# Patient Record
Sex: Male | Born: 1995 | Race: White | Hispanic: No | Marital: Single | Smoking: Never smoker
Health system: Southern US, Community
[De-identification: ages and names within clinical notes are randomized; demographics above are authoritative.]

---

## 2017-04-30 ENCOUNTER — Emergency Department: Payer: Self-pay

## 2017-04-30 ENCOUNTER — Emergency Department
Admission: EM | Admit: 2017-04-30 | Discharge: 2017-04-30 | Disposition: A | Discharge status: Home / Self Care | Payer: Self-pay | Attending: Emergency Medicine | Admitting: Emergency Medicine

## 2017-04-30 ENCOUNTER — Encounter: Payer: Self-pay | Admitting: Emergency Medicine

## 2017-04-30 DIAGNOSIS — M25511 Pain in right shoulder: Secondary | ICD-10-CM | POA: Insufficient documentation

## 2017-04-30 DIAGNOSIS — S43101A Unspecified dislocation of right acromioclavicular joint, initial encounter: Secondary | ICD-10-CM

## 2017-04-30 DIAGNOSIS — F10920 Alcohol use, unspecified with intoxication, uncomplicated: Secondary | ICD-10-CM | POA: Insufficient documentation

## 2017-04-30 DIAGNOSIS — Y929 Unspecified place or not applicable: Secondary | ICD-10-CM | POA: Insufficient documentation

## 2017-04-30 DIAGNOSIS — Y999 Unspecified external cause status: Secondary | ICD-10-CM | POA: Insufficient documentation

## 2017-04-30 DIAGNOSIS — Y9389 Activity, other specified: Secondary | ICD-10-CM | POA: Insufficient documentation

## 2017-04-30 DIAGNOSIS — S43121A Dislocation of right acromioclavicular joint, 100%-200% displacement, initial encounter: Secondary | ICD-10-CM | POA: Insufficient documentation

## 2017-04-30 MED ORDER — KETOROLAC TROMETHAMINE 30 MG/ML IJ SOLN
30.0000 mg | Freq: Once | INTRAMUSCULAR | Status: AC
  Administered 2017-04-30: 30 mg via INTRAVENOUS
  Filled 2017-04-30: qty 1

## 2017-04-30 MED ORDER — KETOROLAC TROMETHAMINE 60 MG/2ML IM SOLN: 60.0000 mg | Freq: Once | INTRAMUSCULAR | Status: DC

## 2017-04-30 MED ORDER — TRAMADOL HCL 50 MG PO TABS
50.0000 mg | ORAL_TABLET | Freq: Once | ORAL | Status: AC
  Administered 2017-04-30: 50 mg via ORAL
  Filled 2017-04-30: qty 1

## 2017-04-30 MED ORDER — ETODOLAC 200 MG PO CAPS: 200.0000 mg | ORAL_CAPSULE | Freq: Three times a day (TID) | ORAL | 0 refills | Status: DC

## 2017-04-30 MED ORDER — TRAMADOL HCL 50 MG PO TABS: 50.0000 mg | ORAL_TABLET | Freq: Four times a day (QID) | ORAL | 0 refills | Status: DC | PRN
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
## 2017-04-30 NOTE — ED Notes (Signed)
Patient transported to CT 
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
## 2023-05-06 ENCOUNTER — Emergency Department (HOSPITAL_COMMUNITY)
Admission: EM | Admit: 2023-05-06 | Discharge: 2023-05-06 | Disposition: A | Discharge status: Home / Self Care | Payer: No Typology Code available for payment source | Attending: Emergency Medicine | Admitting: Emergency Medicine

## 2023-05-06 ENCOUNTER — Other Ambulatory Visit: Payer: Self-pay

## 2023-05-06 ENCOUNTER — Encounter (HOSPITAL_COMMUNITY): Payer: Self-pay | Admitting: Emergency Medicine

## 2023-05-06 DIAGNOSIS — S51011A Laceration without foreign body of right elbow, initial encounter: Secondary | ICD-10-CM | POA: Diagnosis not present

## 2023-05-06 DIAGNOSIS — W25XXXA Contact with sharp glass, initial encounter: Secondary | ICD-10-CM | POA: Insufficient documentation

## 2023-05-06 DIAGNOSIS — S41111A Laceration without foreign body of right upper arm, initial encounter: Secondary | ICD-10-CM

## 2023-05-06 MED ORDER — LIDOCAINE HCL (PF) 1 % IJ SOLN
5.0000 mL | Freq: Once | INTRAMUSCULAR | Status: AC
  Administered 2023-05-06: 5 mL via INTRADERMAL

## 2023-05-06 MED ORDER — LIDOCAINE HCL (PF) 1 % IJ SOLN
INTRAMUSCULAR | Status: AC
  Filled 2023-05-06: qty 5
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
## 2023-05-06 NOTE — ED Notes (Signed)
ED Provider at bedside. 
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
## 2023-05-06 NOTE — Discharge Instructions (Signed)
Local wound care with bacitracin and dressing changes twice daily.  Sutures are to be removed in 7 to 10 days.  Please follow-up with primary doctor, urgent care, or the ER for this.  Return to the ER sooner if you develop redness, increased pain, pus draining from the wound, streaks up or down the arm, or for other new and concerning symptoms.

## 2024-06-11 NOTE — Progress Notes (Signed)
 " Chief Complaint  Patient presents with   Anxiety    Depression/anxiety follow-up--meds   Medication Refill    Aripiprazole 5 mg, Quetiapine  50 mg, venlafaxine 75 mg   Medication Review    Subjective  Cory is a 28 y.o. male who presents for Anxiety (Depression/anxiety follow-up--meds), Medication Refill (Aripiprazole 5 mg, Quetiapine  50 mg, venlafaxine 75 mg), and Medication Review HPI History of Present Illness Cory Vance is a 28 year old male with a history of substance use disorder who presents for follow-up after completing rehab.  He has a history of alcohol use that initially decreased but later escalated, leading to the use of opiate pills like Percocet and hydrocodone. This problematic use prompted his decision to enter rehab. He has been sober for six months.  During rehab, he was prescribed Seroquel  for sleep difficulties and antidepressants. He is currently on a Suboxone  taper, reducing from 16 mg to 8 mg per day, and takes Effexor 225 mg three times daily, with his supply running low.  He experiences ongoing depression, which he feels has worsened since achieving sobriety. Depression contributed to his substance use as he sought to feel normal or good. Suboxone  helps prevent physical withdrawal symptoms, described as flu-like, but depression persists. No current psychiatric follow-up.  Review of Systems  Patient Active Problem List  Diagnosis   Separation of right acromioclavicular joint   Essential hypertension   Panic attacks   Major depression, melancholic type   Bicuspid aortic valve   Mild aortic stenosis by prior echocardiogram   Radial neuropathy, right    Outpatient Medications Prior to Visit  Medication Sig Dispense Refill   buprenorphine -naloxone  (SUBOXONE ) 8-2 mg SL film PLACE 1 FILM INSIDE THE CHEEK DAILY     cloNIDine  HCL (CATAPRES ) 0.1 MG tablet TAKE ONE TABLET BY MOUTH TWICE DAILY AS NEEDED 8AM AND 9PM. MAX TWO TABLETS.     ibuprofen  (ADVIL,MOTRIN) 200 MG tablet Take 800 mg by mouth every 6 (six) hours as needed for Pain.     ARIPiprazole (ABILIFY) 5 MG tablet TAKE ONE TABLET BY MOUTH ONCE DAILY 8AM     QUEtiapine  (SEROQUEL ) 50 MG tablet TAKE ONE TABLET BY MOUTH ONCE DAILY AS NEEDED AT 9PM FOR INSOMNIA. 24 HOUR MAX IS ONE TABLET     venlafaxine (EFFEXOR-XR) 75 MG XR capsule Take 1 capsule (75 mg total) by mouth once daily 30 capsule 11   aspirin 81 MG EC tablet Take 81 mg by mouth once daily (Patient not taking: Reported on 06/11/2024)     lisinopriL (ZESTRIL) 10 MG tablet Take 1 tablet (10 mg total) by mouth once daily (Patient not taking: Reported on 06/11/2024) 90 tablet 3   propranoloL (INDERAL) 20 MG tablet Take 1 tablet (20 mg total) by mouth 2 (two) times daily as needed (Patient not taking: Reported on 06/11/2024) 60 tablet 11   No facility-administered medications prior to visit.      Objective  Vitals:   06/11/24 0937  BP: 100/64  Pulse: 53  Weight: 100.3 kg (221 lb 1.9 oz)  PainSc: 0-No pain   Body mass index is 33.83 kg/m.  Home Vitals:     Physical Exam Physical Exam    Constitutional: alert, in NAD, and communicates well Eye exam: pupils equal and reactive, extraocular eye movements intact. Neck: supple, no thyroid enlargement or cervical adenopathy, and no bruits heard Respiratory: clear to auscultation, without rales or wheezes  Cardiovascular: regular rate and rhythm and without murmurs, rubs or gallops Lower  extremities: no lower extremity edema Skin ankles/feet: warm, good capillary refill and no ulcerations or lesions noted Neurological: sensorimotor grossly intact and normal muscle tone  Results       Assessment/Plan:   Assessment & Plan Substance Use Disorder History of alcohol and opiates, including Percocet and hydrocodone. Sober for six months post-rehabilitation. On Suboxone  taper from 16 mg to 8 mg daily. Attends men's group for support. Suboxone  prevents withdrawal  but not depression. - Continue Suboxone  taper. - Attend men's group for support.  Major Depressive Disorder Worsening depression since sobriety. On Effexor 225 mg daily with persistent symptoms. Discussed adding Wellbutrin to address cravings and improve mood. Wellbutrin is non-addictive, helps with cravings, may cause initial irritability. - Prescribe Wellbutrin in addition to Effexor. - Monitor mood and depressive symptoms. - Reassess in one month.  Insomnia Difficulty sleeping. Seroquel  50 mg effective, safe, and non-addictive for insomnia. - Continue Seroquel  50 mg for insomnia. Diagnoses and all orders for this visit:  Major depression, melancholic type -     Follow up in Primary Care; Future  Depression screening (Z13.31) -     Depression Screen -(PHQ- 2/9, BDI)  Encounter for behavioral health screening (Z13.30) -     Anxiety Screen [NUR6106]  Other orders -     Follow up in Primary Care -     venlafaxine (EFFEXOR-XR) 75 MG XR capsule; Take 3 capsules (225 mg total) by mouth once daily -     buPROPion (WELLBUTRIN XL) 150 MG XL tablet; Take 1 tablet (150 mg total) by mouth once daily -     ARIPiprazole (ABILIFY) 5 MG tablet; Take 1 tablet (5 mg total) by mouth once daily -     QUEtiapine  (SEROQUEL ) 50 MG tablet; Take 1 tablet (50 mg total) by mouth at bedtime        This visit was coded based on medical decision making (MDM).       Follow-up     Normal Orders This Visit   Follow up in Primary Care [[id number] Custom]    Questions:   Does this order need to be coordinated with another visit or should it be hidden from the patient portal? If yes to either, the patient will need to stop by the front desk or call to schedule.: No   Who is this follow-up with?: Me   Can this appointment be overbooked by a scheduler?: No   What type of follow up is needed?: Office Visit   What's the reason for follow up?: Depression/Anxiety   Allow telemedicine?: In Person Only     Future Labs/Procedures Expected by Expires   Follow up in Primary Care [[id number] Custom]  07/12/2024 06/11/2025   Questions:     Does this order need to be coordinated with another visit or should it be hidden from the patient portal? If yes to either, the patient will need to stop by the front desk or call to schedule.: No   Who is this follow-up with?: Me   Can this appointment be overbooked by a scheduler?: No   What type of follow up is needed?: Office Visit   What's the reason for follow up?: Depression/Anxiety   Allow telemedicine?: In Person Only         Future Appointments     Date/Time Provider Department Center Visit Type   07/12/2024 8:40 AM (Arrive by 8:25 AM) Zina, Selma Nottingham, MD [facility] OFFICE VISIT  There are no Patient Instructions on file for this visit.  An after visit summary was provided for the patient either in written format (printed) or through [facility].  This note has been created using automated tools and reviewed for accuracy by ALEXANDRE W HUIN.  "

## 2024-12-11 ENCOUNTER — Encounter: Payer: Self-pay | Admitting: *Deleted

## 2024-12-11 ENCOUNTER — Other Ambulatory Visit: Payer: Self-pay

## 2024-12-11 ENCOUNTER — Emergency Department
Admission: EM | Admit: 2024-12-11 | Discharge: 2024-12-12 | Disposition: A | Attending: Emergency Medicine | Admitting: Emergency Medicine

## 2024-12-11 DIAGNOSIS — R45851 Suicidal ideations: Secondary | ICD-10-CM | POA: Insufficient documentation

## 2024-12-11 DIAGNOSIS — Y906 Blood alcohol level of 120-199 mg/100 ml: Secondary | ICD-10-CM | POA: Diagnosis not present

## 2024-12-11 DIAGNOSIS — R519 Headache, unspecified: Secondary | ICD-10-CM | POA: Insufficient documentation

## 2024-12-11 DIAGNOSIS — F1010 Alcohol abuse, uncomplicated: Secondary | ICD-10-CM

## 2024-12-11 DIAGNOSIS — F1090 Alcohol use, unspecified, uncomplicated: Secondary | ICD-10-CM | POA: Diagnosis present

## 2024-12-11 DIAGNOSIS — F332 Major depressive disorder, recurrent severe without psychotic features: Secondary | ICD-10-CM | POA: Diagnosis present

## 2024-12-11 DIAGNOSIS — F10988 Alcohol use, unspecified with other alcohol-induced disorder: Secondary | ICD-10-CM | POA: Diagnosis not present

## 2024-12-11 DIAGNOSIS — I10 Essential (primary) hypertension: Secondary | ICD-10-CM | POA: Insufficient documentation

## 2024-12-11 DIAGNOSIS — R112 Nausea with vomiting, unspecified: Secondary | ICD-10-CM | POA: Insufficient documentation

## 2024-12-11 DIAGNOSIS — Z046 Encounter for general psychiatric examination, requested by authority: Secondary | ICD-10-CM

## 2024-12-11 LAB — COMPREHENSIVE METABOLIC PANEL WITH GFR
ALT: 99 U/L — ABNORMAL HIGH (ref 0–44)
AST: 52 U/L — ABNORMAL HIGH (ref 15–41)
Albumin: 5.3 g/dL — ABNORMAL HIGH (ref 3.5–5.0)
Alkaline Phosphatase: 102 U/L (ref 38–126)
Anion gap: 14 (ref 5–15)
BUN: 9 mg/dL (ref 6–20)
CO2: 25 mmol/L (ref 22–32)
Calcium: 10 mg/dL (ref 8.9–10.3)
Chloride: 102 mmol/L (ref 98–111)
Creatinine, Ser: 0.88 mg/dL (ref 0.61–1.24)
GFR, Estimated: >60 mL/min
Glucose, Bld: 109 mg/dL — ABNORMAL HIGH (ref 70–99)
Potassium: 4 mmol/L (ref 3.5–5.1)
Sodium: 141 mmol/L (ref 135–145)
Total Bilirubin: 0.3 mg/dL (ref 0.0–1.2)
Total Protein: 8.5 g/dL — ABNORMAL HIGH (ref 6.5–8.1)

## 2024-12-11 LAB — CBC
HCT: 43.2 % (ref 39.0–52.0)
Hemoglobin: 15.2 g/dL (ref 13.0–17.0)
MCH: 28.7 pg (ref 26.0–34.0)
MCHC: 35.2 g/dL (ref 30.0–36.0)
MCV: 81.7 fL (ref 80.0–100.0)
Platelets: 299 10*3/uL (ref 150–400)
RBC: 5.29 MIL/uL (ref 4.22–5.81)
RDW: 12.1 % (ref 11.5–15.5)
WBC: 7.2 10*3/uL (ref 4.0–10.5)
nRBC: 0 % (ref 0.0–0.2)

## 2024-12-11 LAB — ETHANOL: Alcohol, Ethyl (B): 142 mg/dL — ABNORMAL HIGH

## 2024-12-11 MED ORDER — BUPRENORPHINE HCL-NALOXONE HCL 8-2 MG SL SUBL
1.0000 | SUBLINGUAL_TABLET | SUBLINGUAL | Status: AC
  Administered 2024-12-12: 1 via SUBLINGUAL
  Filled 2024-12-11: qty 1

## 2024-12-11 MED ORDER — FOLIC ACID 1 MG PO TABS
1.0000 mg | ORAL_TABLET | Freq: Every day | ORAL | Status: DC
  Administered 2024-12-12: 1 mg via ORAL
  Filled 2024-12-11: qty 1

## 2024-12-11 MED ORDER — LORAZEPAM 1 MG PO TABS: 1.0000 mg | ORAL_TABLET | ORAL | Status: DC | PRN

## 2024-12-11 MED ORDER — THIAMINE MONONITRATE 100 MG PO TABS
100.0000 mg | ORAL_TABLET | Freq: Every day | ORAL | Status: DC
  Administered 2024-12-12: 100 mg via ORAL
  Filled 2024-12-11: qty 1

## 2024-12-11 MED ORDER — ADULT MULTIVITAMIN W/MINERALS CH
1.0000 | ORAL_TABLET | Freq: Every day | ORAL | Status: DC
  Administered 2024-12-12: 1 via ORAL
  Filled 2024-12-11: qty 1

## 2024-12-11 MED ORDER — KETOROLAC TROMETHAMINE 30 MG/ML IJ SOLN
30.0000 mg | Freq: Once | INTRAMUSCULAR | Status: AC
  Administered 2024-12-11: 30 mg via INTRAMUSCULAR
  Filled 2024-12-11: qty 1

## 2024-12-11 MED ORDER — ONDANSETRON 4 MG PO TBDP
8.0000 mg | ORAL_TABLET | Freq: Once | ORAL | Status: AC
  Administered 2024-12-11: 8 mg via ORAL
  Filled 2024-12-11: qty 2

## 2024-12-11 MED ORDER — LORAZEPAM 2 MG/ML IJ SOLN: 1.0000 mg | INTRAMUSCULAR | Status: DC | PRN

## 2024-12-11 MED ORDER — THIAMINE HCL 100 MG/ML IJ SOLN: 100.0000 mg | Freq: Every day | INTRAMUSCULAR | Status: DC
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
## 2024-12-11 NOTE — ED Provider Notes (Signed)
 "  [facility] Provider Note    Event Date/Time   First MD Initiated Contact with Patient 12/11/24 2313     (approximate)   History   Psychiatric Evaluation   HPI  Cory Vance is a 29 y.o. male history of substance abuse Reviewed external records from 06/11/25 Dr. Zina  He has a history of alcohol use that initially decreased but later escalated, leading to the use of opiate pills like Percocet and hydrocodone. This problematic use prompted his decision to enter rehab. He has been sober for six months.   During rehab, he was prescribed Seroquel  for sleep difficulties and antidepressants. He is currently on a Suboxone  taper, reducing from 16 mg to 8 mg per day, and takes Effexor 225 mg three times daily, with his supply running low.   He experiences ongoing depression, which he feels has worsened since achieving sobriety. Depression contributed to his substance use as he sought to feel normal or good. Suboxone  helps prevent physical withdrawal symptoms, described as flu-like, but depression persists. No current psychiatric follow-up.     Review of Systems      Patient Active Problem List  Diagnosis   Separation of right acromioclavicular joint   Essential hypertension   Panic attacks   Major depression, melancholic type   Bicuspid aortic valve   Mild aortic stenosis by prior echocardiogram   Radial neuropathy, right    Patient report he started drinking he has not drank alcohol in about 6 or more months.  Today he estimates he had at least 4 high alcohol drinks.  He then went out to his garage reports that he moved his lawn mower and was planning to take his car out when the police arrived.  He does not specifically recall being suicidal, reports that he does not remember all of it because he was drinking so heavily.  Apparently reported to have made threats of suicide and grabbed a gun going out into his garage prompting call to police  Presently  reports a mild headache some nausea.  Reports he also has not had Suboxone  in about 2 days and that will lead to him nausea and vomiting if he does not get it soon.  No recent medical illness.  Reports a mild to moderate headache, thinks it is probably related to having drank too much alcohol tonight.  Denies being in an enclosed space with running engines.  History reviewed. No pertinent past medical history.   Physical Exam   Triage Vital Signs: ED Triage Vitals  Encounter Vitals Group     BP 12/11/24 2244 (!) 141/117     Girls Systolic BP Percentile --      Girls Diastolic BP Percentile --      Boys Systolic BP Percentile --      Boys Diastolic BP Percentile --      Pulse Rate 12/11/24 2244 (!) 108     Resp 12/11/24 2244 18     Temp 12/11/24 2244 98.2 F (36.8 C)     Temp Source 12/11/24 2244 Oral     SpO2 12/11/24 2244 97 %     Weight 12/11/24 2242 220 lb (99.8 kg)     Height 12/11/24 2242 5' 9 (1.753 m)     Head Circumference --      Peak Flow --      Pain Score --      Pain Loc --      Pain Education --  Exclude from Growth Chart --     Most recent vital signs: Vitals:   12/11/24 2244 12/11/24 2318  BP: (!) 141/117 (!) 147/117  Pulse: (!) 108 (!) 108  Resp: 18   Temp: 98.2 F (36.8 C)   SpO2: 97%      General: Awake, no distress.  CV:   Good peripheral perfusion. Normal rate and heart tones. Resp:   Normal effort. Lung sounds clear bilateral. Speaking without distress. Abd:   No distention. Soft, non-tender to palpation in all quadrants. No rebound or guarding. Neuro:   No focal neuro deficits noted. Moves extremities well without noted concern. Other:     ED Results / Procedures / Treatments   Labs (all labs ordered are listed, but only abnormal results are displayed) Labs Reviewed  COMPREHENSIVE METABOLIC PANEL WITH GFR - Abnormal; Notable for the following components:      Result Value   Glucose, Bld 109 (*)    Total Protein 8.5 (*)     Albumin 5.3 (*)    AST 52 (*)    ALT 99 (*)    All other components within normal limits  ETHANOL - Abnormal; Notable for the following components:   Alcohol, Ethyl (B) 142 (*)    All other components within normal limits  COOXEMETRY PANEL - Abnormal; Notable for the following components:   Carboxyhemoglobin 2.0 (*)    All other components within normal limits  ACETAMINOPHEN LEVEL - Abnormal; Notable for the following components:   Acetaminophen (Tylenol), Serum <10 (*)    All other components within normal limits  SALICYLATE LEVEL - Abnormal; Notable for the following components:   Salicylate Lvl <7.0 (*)    All other components within normal limits  CBC  URINE DRUG SCREEN   Normal CBC  Coox reassuing  Chemistry panel no acute departures other than mild transaminitis but he has no associated abdominal pain and with his heavy alcohol history in the past, I do not think this is an acute hepatitis type picture probably more of a chronic hepatic irritation inflammation or alcohol related liver disease though the pattern is slightly more so that of a NASH or fatty liver or steatosis type pattern  Nonetheless without associate abdominal pain white count fever I do not think there is acute clinical concern       PROCEDURES:  Critical Care performed: No  Procedures   MEDICATIONS ORDERED IN ED: Medications  LORazepam  (ATIVAN ) tablet 1-4 mg (has no administration in time range)    Or  LORazepam  (ATIVAN ) injection 1-4 mg (has no administration in time range)  thiamine  (VITAMIN B1) tablet 100 mg (has no administration in time range)    Or  thiamine  (VITAMIN B1) injection 100 mg (has no administration in time range)  folic acid  (FOLVITE ) tablet 1 mg (has no administration in time range)  multivitamin with minerals tablet 1 tablet (has no administration in time range)  ketorolac  (TORADOL ) 30 MG/ML injection 30 mg (30 mg Intramuscular Given 12/11/24 2355)  ondansetron  (ZOFRAN -ODT)  disintegrating tablet 8 mg (8 mg Oral Given 12/11/24 2355)  buprenorphine -naloxone  (SUBOXONE ) 8-2 mg per SL tablet 1 tablet (1 tablet Sublingual Given 12/12/24 0011)     IMPRESSION / MDM / ASSESSMENT AND PLAN / ED COURSE  I reviewed the triage vital signs and the nursing notes.                              Based  on presentation, the differential diagnosis includes, but is not limited to key considerations:  Patient denies making suicidal statement, but also reports he drank heavily does not seem to be clearly remembering all of the night.  Normocephalic atraumatic very pleasant.  Remorseful about drinking reports he was sober for 6+ months before that.  Under IVC pending psych consult.  He reports headache, neurovascular intact no focal deficits.  I am suspicious that this may be related to heavy alcohol use, but given report of a possible riding lawnmower and him being out in the garage around a running vehicle now with a headache we will check a carboxyhemoglobin though I suspect based on the history provides very unlikely to represent carbon oxide  Salicylate acetaminophen levels negative    Patient's presentation is most consistent with acute complicated illness / injury requiring diagnostic workup.    Clinical Course as of 12/12/24 0411  Wed Dec 12, 2024  0054 Resp therapy called. Normal met hgb, normal CO. [MQ]    Clinical Course User Index [MQ] Dicky Anes, MD   ----------------------------------------- 4:11 AM on 12/12/2024 ----------------------------------------- Patient resting.  If medically appropriate to undergo psychiatric consult at this time  The patient has been placed in psychiatric observation due to the need to provide a safe environment for the patient while obtaining psychiatric consultation and evaluation, as well as ongoing medical and medication management to treat the patient's condition.  The patient Gerri Breeze at this time been placed under full IVC at  this time.   FINAL CLINICAL IMPRESSION(S) / ED DIAGNOSES   Final diagnoses:  Alcohol abuse  Involuntary commitment  Acute nonintractable headache, unspecified headache type     Rx / DC Orders   ED Discharge Orders     None        Note:  This document was prepared using Dragon voice recognition software and may include unintentional dictation errors.   Dicky Anes, MD 12/12/24 0411  "

## 2024-12-11 NOTE — ED Notes (Addendum)
 Camo clogs [employer] Black pants Black socks Green camo  coat  [employer] boxers

## 2024-12-12 ENCOUNTER — Encounter: Payer: Self-pay | Admitting: Psychiatry

## 2024-12-12 ENCOUNTER — Inpatient Hospital Stay
Admission: AD | Admit: 2024-12-12 | Discharge: 2024-12-19 | Disposition: A | Discharge status: Home / Self Care | Source: Ambulatory Visit | Attending: Psychiatry | Admitting: Psychiatry

## 2024-12-12 DIAGNOSIS — F1090 Alcohol use, unspecified, uncomplicated: Secondary | ICD-10-CM | POA: Diagnosis present

## 2024-12-12 DIAGNOSIS — F332 Major depressive disorder, recurrent severe without psychotic features: Secondary | ICD-10-CM | POA: Diagnosis present

## 2024-12-12 DIAGNOSIS — Z046 Encounter for general psychiatric examination, requested by authority: Secondary | ICD-10-CM

## 2024-12-12 DIAGNOSIS — R45851 Suicidal ideations: Secondary | ICD-10-CM

## 2024-12-12 LAB — COOXEMETRY PANEL
Carboxyhemoglobin: 2 % — ABNORMAL HIGH (ref 0.5–1.5)
Methemoglobin: <0.7 % (ref 0.0–1.5)
O2 Saturation: 95 %
Total hemoglobin: 14.5 g/dL (ref 12.0–16.0)
Total oxygen content: 92.6 %

## 2024-12-12 LAB — SALICYLATE LEVEL: Salicylate Lvl: <7 mg/dL — ABNORMAL LOW (ref 7.0–30.0)

## 2024-12-12 LAB — ACETAMINOPHEN LEVEL: Acetaminophen (Tylenol), Serum: <10 ug/mL — ABNORMAL LOW (ref 10–30)

## 2024-12-12 MED ORDER — HALOPERIDOL LACTATE 5 MG/ML IJ SOLN: 10.0000 mg | Freq: Three times a day (TID) | INTRAMUSCULAR | Status: DC | PRN

## 2024-12-12 MED ORDER — CHLORDIAZEPOXIDE HCL 25 MG PO CAPS: 25.0000 mg | ORAL_CAPSULE | Freq: Four times a day (QID) | ORAL | Status: DC | PRN

## 2024-12-12 MED ORDER — THIAMINE HCL 100 MG/ML IJ SOLN
100.0000 mg | Freq: Once | INTRAMUSCULAR | Status: AC
  Administered 2024-12-12: 100 mg via INTRAMUSCULAR
  Filled 2024-12-12: qty 2

## 2024-12-12 MED ORDER — NICOTINE 14 MG/24HR TD PT24
14.0000 mg | MEDICATED_PATCH | Freq: Every day | TRANSDERMAL | Status: DC
  Administered 2024-12-12 – 2024-12-18 (×7): 14 mg via TRANSDERMAL
  Filled 2024-12-12 (×7): qty 1

## 2024-12-12 MED ORDER — CHLORDIAZEPOXIDE HCL 25 MG PO CAPS: 25.0000 mg | ORAL_CAPSULE | ORAL | Status: DC

## 2024-12-12 MED ORDER — CLONIDINE HCL 0.1 MG PO TABS: 0.1000 mg | ORAL_TABLET | Freq: Every day | ORAL | Status: DC

## 2024-12-12 MED ORDER — HYDROXYZINE HCL 25 MG PO TABS
25.0000 mg | ORAL_TABLET | Freq: Four times a day (QID) | ORAL | Status: AC | PRN
  Administered 2024-12-13 – 2024-12-14 (×2): 25 mg via ORAL
  Filled 2024-12-12 (×2): qty 1

## 2024-12-12 MED ORDER — LORAZEPAM 2 MG/ML IJ SOLN: 2.0000 mg | Freq: Three times a day (TID) | INTRAMUSCULAR | Status: DC | PRN

## 2024-12-12 MED ORDER — CHLORDIAZEPOXIDE HCL 25 MG PO CAPS
25.0000 mg | ORAL_CAPSULE | Freq: Four times a day (QID) | ORAL | Status: AC
  Administered 2024-12-12 – 2024-12-13 (×4): 25 mg via ORAL
  Filled 2024-12-12 (×4): qty 1

## 2024-12-12 MED ORDER — HALOPERIDOL 5 MG PO TABS: 5.0000 mg | ORAL_TABLET | Freq: Three times a day (TID) | ORAL | Status: DC | PRN

## 2024-12-12 MED ORDER — HALOPERIDOL LACTATE 5 MG/ML IJ SOLN: 5.0000 mg | Freq: Three times a day (TID) | INTRAMUSCULAR | Status: DC | PRN

## 2024-12-12 MED ORDER — CLONIDINE HCL 0.1 MG PO TABS
0.1000 mg | ORAL_TABLET | Freq: Four times a day (QID) | ORAL | Status: DC
  Administered 2024-12-12 (×2): 0.1 mg via ORAL
  Filled 2024-12-12 (×5): qty 1

## 2024-12-12 MED ORDER — DIPHENHYDRAMINE HCL 25 MG PO CAPS: 50.0000 mg | ORAL_CAPSULE | Freq: Three times a day (TID) | ORAL | Status: DC | PRN

## 2024-12-12 MED ORDER — QUETIAPINE FUMARATE 25 MG PO TABS
50.0000 mg | ORAL_TABLET | Freq: Every day | ORAL | Status: DC
  Administered 2024-12-12 – 2024-12-15 (×4): 50 mg via ORAL
  Filled 2024-12-12 (×4): qty 2

## 2024-12-12 MED ORDER — CHLORDIAZEPOXIDE HCL 25 MG PO CAPS: 25.0000 mg | ORAL_CAPSULE | Freq: Every day | ORAL | Status: DC

## 2024-12-12 MED ORDER — CLONIDINE HCL 0.1 MG PO TABS: 0.1000 mg | ORAL_TABLET | ORAL | Status: DC

## 2024-12-12 MED ORDER — DIPHENHYDRAMINE HCL 50 MG/ML IJ SOLN: 50.0000 mg | Freq: Three times a day (TID) | INTRAMUSCULAR | Status: DC | PRN

## 2024-12-12 MED ORDER — LOPERAMIDE HCL 2 MG PO CAPS: 2.0000 mg | ORAL_CAPSULE | ORAL | Status: AC | PRN

## 2024-12-12 MED ORDER — CHLORDIAZEPOXIDE HCL 25 MG PO CAPS: 25.0000 mg | ORAL_CAPSULE | Freq: Three times a day (TID) | ORAL | Status: DC

## 2024-12-12 MED ORDER — ONDANSETRON 4 MG PO TBDP: 4.0000 mg | ORAL_TABLET | Freq: Four times a day (QID) | ORAL | Status: AC | PRN

## 2024-12-12 MED ORDER — METHOCARBAMOL 500 MG PO TABS: 500.0000 mg | ORAL_TABLET | Freq: Three times a day (TID) | ORAL | Status: AC | PRN

## 2024-12-12 MED ORDER — NAPROXEN 500 MG PO TABS: 500.0000 mg | ORAL_TABLET | Freq: Two times a day (BID) | ORAL | Status: AC | PRN

## 2024-12-12 MED ORDER — DICYCLOMINE HCL 20 MG PO TABS: 20.0000 mg | ORAL_TABLET | Freq: Four times a day (QID) | ORAL | Status: AC | PRN

## 2024-12-12 MED ORDER — ADULT MULTIVITAMIN W/MINERALS CH
1.0000 | ORAL_TABLET | Freq: Every day | ORAL | Status: DC
  Administered 2024-12-12 – 2024-12-19 (×8): 1 via ORAL
  Filled 2024-12-12 (×8): qty 1

## 2024-12-12 NOTE — TOC Initial Note (Signed)
 Transition of Care [facility]) - Initial/Assessment Note    Patient Details  Name: Cory Vance MRN: [id number] Date of Birth: 11-Mar-1996  [facility]) CM/SW Contact:    Kaileb Monsanto L Hymen Arnett, LCSW Phone Number: 12/12/2024, 8:56 AM  Clinical Narrative:                  [facility] consult received for substance abuse education/counseling. TOC does not provide education/counseling. Resources add to the AVS for patient to follow up.        Patient Goals and CMS Choice            Expected Discharge Plan and Services                                              Prior Living Arrangements/Services                       Activities of Daily Living      Permission Sought/Granted                  Emotional Assessment              Admission diagnosis:  IVC Patient Active Problem List   Diagnosis Date Noted   MDD (major depressive disorder), recurrent severe, without psychosis (HCC) 12/12/2024   Alcohol use disorder 12/12/2024   Suicidal ideation 12/12/2024   Involuntary commitment 12/12/2024   PCP:  Freddrick No Pharmacy:   [facility] - [place], [place] - [address] [place] [address] Phone: [phone] Fax: [phone]     Social Drivers of Health (SDOH) Social History: SDOH Screenings   Food Insecurity: No Food Insecurity (04/29/2024)   Received from [facility]  Housing: Low Risk  (04/29/2024)   Received from [facility]  Transportation Needs: No Transportation Needs (04/29/2024)   Received from [facility]  Utilities: Not At Risk (04/29/2024)   Received from [facility]  Financial Resource Strain: Low Risk  (04/29/2024)   Received from [facility]  Tobacco Use: Unknown (12/12/2024)   SDOH Interventions:     Readmission Risk Interventions     No data to display

## 2024-12-12 NOTE — Progress Notes (Signed)
" °   12/12/24 1116  Psych Admission Type (Psych Patients Only)  Admission Status Involuntary  Psychosocial Assessment  Patient Complaints Depression;Anxiety (6/10)  Eye Contact Avoids  Facial Expression Flat  Affect Blunted  Speech Logical/coherent  Interaction Minimal;Superficial  Motor Activity Slow  Appearance/Hygiene Poor hygiene;In scrubs;Disheveled  Behavior Characteristics Guarded  Mood Depressed  Thought Process  Coherency WDL  Content WDL  Delusions None reported or observed  Perception WDL  Hallucination None reported or observed  Judgment Impaired  Confusion WDL  Danger to Self  Current suicidal ideation? Denies    "

## 2024-12-12 NOTE — Group Note (Signed)
 [facility] LCSW Group Therapy Note   Group Date: 12/12/2024 Start Time: 1300 End Time: 1440   Type of Therapy/Topic:  Group Therapy:  Emotion Regulation  Participation Level:  Did Not Attend   Description of Group:    The purpose of this group is to assist patients in learning to regulate negative emotions and experience positive emotions. Patients will be guided to discuss ways in which they have been vulnerable to their negative emotions. These vulnerabilities will be juxtaposed with experiences of positive emotions or situations, and patients challenged to use positive emotions to combat negative ones. Special emphasis will be placed on coping with negative emotions in conflict situations, and patients will process healthy conflict resolution skills.  Therapeutic Goals: Patient will identify two positive emotions or experiences to reflect on in order to balance out negative emotions:  Patient will label two or more emotions that they find the most difficult to experience:  Patient will be able to demonstrate positive conflict resolution skills through discussion or role plays:   Summary of Patient Progress: Patient did not attend group.  Therapeutic Modalities:   Cognitive Behavioral Therapy Feelings Identification Dialectical Behavioral Therapy   Nadara JONELLE Fam, LCSW

## 2024-12-12 NOTE — BH Assessment (Addendum)
 Patient is to be admitted to [facility] by Psych NP Virginia  Georgina.  Attending Physician will be Dr. Jadapalle.   Patient has been assigned to room 302, by [facility].   Intake Paper Work has been signed and placed on patient chart.  ER staff is aware of the admission: Carlene, ER Secretary   Dr. Dicky, ER MD  Daralyn, Patient's Nurse    Patient accepted to BMU 302 PENDING UDS and  EKG. Pt can arrive 1/28 at 0800

## 2024-12-12 NOTE — Group Note (Signed)
 Date:  12/12/2024 Time:  10:16 AM  Group Topic/Focus:  Goals Group:   The focus of this group is to help patients establish daily goals to achieve during treatment and discuss how the patient can incorporate goal setting into their daily lives to aide in recovery.    Participation Level:  Did Not Attend   Cory Vance 12/12/2024, 10:16 AM

## 2024-12-12 NOTE — Plan of Care (Signed)
   Problem: Education: Goal: Emotional status will improve Outcome: Progressing

## 2024-12-12 NOTE — ED Provider Notes (Signed)
 The patient has been placed in psychiatric observation due to the need to provide a safe environment for the patient while obtaining psychiatric consultation and evaluation, as well as ongoing medical and medication management to treat the patient's condition.  The patient has been placed under full IVC at this time.    Dicky Anes, MD 12/12/24 [phone]

## 2024-12-12 NOTE — ED Notes (Signed)
 Patient sleeping

## 2024-12-12 NOTE — Progress Notes (Signed)
 Pt calm and pleasant during assessment denying SI/HI/AVH. Pt just stated he was tired and wanted to sleep. Pt compliant with medication administration. The night time dose of Clonidine  was held due to order parameters not being met, check MAR. Pt given education, support, and encouragement to be active in his treatment plan. Pt being monitored Q 15 minutes for safety per unit protocol, remains safe on the unit

## 2024-12-12 NOTE — Consult Note (Cosign Needed)
 " Cory Vance Telepsychiatry Consult Note  Patient Name: Cory Vance MRN: [id number] DOB: 04-22-1996 DATE OF Consult: 12/12/2024 Consult Order details:  Orders (From admission, onward)     Start     Ordered   12/11/24 2315  IP CONSULT TO PSYCHIATRY       Ordering Provider: Dicky Anes, MD  Provider:  (Not yet assigned)  Question Answer Comment  Reason for consult: Other (see comments)   Comments: ivc      12/11/24 2314   12/11/24 2315  CONSULT TO CALL ACT TEAM       Ordering Provider: Dicky Anes, MD  Provider:  (Not yet assigned)  Question:  Reason for Consult?  Answer:  ivc   12/11/24 2314            PRIMARY PSYCHIATRIC DIAGNOSES  1.  MDD recurrent severe without psychosis 2.  Alcohol use disorder   3.  SI  RECOMMENDATIONS  Recommendations: Medication recommendations: needs proper reconciliation prior to ordering; pt poor historian at this time Non-Medication/therapeutic recommendations: monitor CIWA and treat as clinically indicated per hospital standard orders;  recommend follow up with dual dx mental health prescriber and psychotherapist once discharged;  needs safety plan for wife to secure firearms/weapons prior to discharge We recommend inpatient psychiatric hospitalization after medical hospitalization. Patient has been involuntarily committed on 12/11/24.  Communication: Treatment team members (and family members if applicable) who were involved in treatment/care discussions and planning, and with whom we spoke or engaged with via secure text/chat, include the following: epic chat Nurse Cory Vance; Cory Vance present for assessment  I personally spent a total of 60 minutes in the care of the patient today including preparing to see the patient, getting/reviewing separately obtained history, performing a medically appropriate exam/evaluation, counseling and educating, referring and communicating with other health care professionals, documenting clinical information in the EHR,  independently interpreting results, and coordinating care.  Thank you for involving us  in the care of this patient. If you have any additional questions or concerns, please call [phone] and ask for me or the provider on-call.  TELEPSYCHIATRY ATTESTATION & CONSENT  As the provider for this telehealth consult, I attest that I verified the patients identity using two separate identifiers, introduced myself to the patient, provided my credentials, disclosed my location, and performed this encounter via a HIPAA-compliant, real-time, face-to-face, two-way, interactive audio and video platform and with the full consent and agreement of the patient (or guardian as applicable.)  Patient physical location: [place] [facility]. Telehealth provider physical location: home office in state of [place]  Video start time: 02:57 am  (Central Time) Video end time: 03:22 am  (Central Time)  IDENTIFYING DATA  Cory Vance is a 29 y.o. year-old male for whom a psychiatric consultation has been ordered by the primary provider. The patient was identified using two separate identifiers.  CHIEF COMPLAINT/REASON FOR CONSULT  I was drinking and I decided I was going to hurt myself or something, I got arrested   HISTORY OF PRESENT ILLNESS (HPI)  The patient presents  to ED via [facility] with IVC after getting into argument with wife, grabbed shotgun, went to shed to pull out his lawn mover per IVC paperwork (reporter believed to drown out sound of shotgun).  LEO arrived pt was sitting in his car, stated to [facility] he was considering talking his life.  Pt reported to have alcohol addiction.     Hx of treatment for  Depression, anxiety  Currently prescribed venlafaxine, pt could not remember other  medications during this assessment (review of record note 06/11/24 was taking bupropion xl, venlafaxine, aripiprazole, quetiapine )  Reports malcompliance feels somewhat effective    Stressor:  denied any   Today, Patient reports symptoms of  depression with anergia, anhedonia, amotivation,  anxiety, frequent worry, feeling restlessness, no reported panic symptoms, no reported obsessive/compulsive behaviors. There is no evidence of psychosis or delusional thinking.  Denied AVH he does appear to exhibit some thought blocking, feels little confused, reportedly due to being awakened for assessment.  Patient no evidence episodes of hypomania, hyperactivity, erratic/excessive spending, involvement in dangerous activities, self-inflated ego, grandiosity, or promiscuity.  sleeping 8 hrs/24hrs, appetite been fine  concentration decreased  behaviors. Reviewed active  medication list/reviewed labs. Obtained Collateral information from medical record.  EKG not available during this encounter  QTC  Called wife Cory Vance [phone] received voicemail, no message left  PAST PSYCHIATRIC HISTORY  Previous Psychiatric Hospitalizations: denied Previous Detox/Residential treatments:hx rehab stayed sober x 6 months per record review Outpt treatment:  pt cannot remember who is prescribing his meds during this assessment Previous psychotropic medication trials: venlafaxine, suboxone  aripiprazole, quetiapine  clonidine  propranolol bupropion campral  Previous mental health diagnosis per Patient/MEDICAL RECORD NUMBERMDD, panic disorder   Suicide attempts/self-injurious behaviors:  denied history of suicidal/homicidal ideation/gestures; denied history of self-harm behaviors  History of trauma/abuse/neglect/exploitation:  denied   Otherwise as per HPI above.  PAST MEDICAL HISTORY  Per record review: hx congential mild aortic valve stenosis and a bicuspid aortic valve insufficiency   HOME MEDICATIONS  Facility Ordered Medications  Medication   LORazepam  (ATIVAN ) tablet 1-4 mg   Or   LORazepam  (ATIVAN ) injection 1-4 mg   thiamine  (VITAMIN B1) tablet 100 mg   Or   thiamine  (VITAMIN B1) injection 100 mg   folic acid  (FOLVITE ) tablet 1 mg   multivitamin with  minerals tablet 1 tablet   [COMPLETED] ketorolac  (TORADOL ) 30 MG/ML injection 30 mg   [COMPLETED] ondansetron  (ZOFRAN -ODT) disintegrating tablet 8 mg   [COMPLETED] buprenorphine -naloxone  (SUBOXONE ) 8-2 mg per SL tablet 1 tablet   PTA Medications  Medication Sig   etodolac  (LODINE ) 200 MG capsule Take 1 capsule (200 mg total) by mouth every 8 (eight) hours.   traMADol  (ULTRAM ) 50 MG tablet Take 1 tablet (50 mg total) by mouth every 6 (six) hours as needed.      ALLERGIES  Allergies[1]  SOCIAL & SUBSTANCE USE HISTORY  Social History   Socioeconomic History   Marital status: Single    Spouse name: Not on file   Number of children: Not on file   Years of education: Not on file   Highest education level: Not on file  Occupational History   Not on file  Tobacco Use   Smoking status: Never   Smokeless tobacco: Not on file  Vaping Use   Vaping status: Every Day  Substance and Sexual Activity   Alcohol use: Yes   Drug use: Never   Sexual activity: Yes  Other Topics Concern   Not on file  Social History Narrative   Not on file   Social Drivers of Health   Tobacco Use: Unknown (12/12/2024)   Patient History    Smoking Tobacco Use: Never    Smokeless Tobacco Use: Unknown    Passive Exposure: Not on file  Financial Resource Strain: Low Risk  (04/29/2024)   Received from [facility]   Overall Financial Resource Strain (CARDIA)    Difficulty of Paying Living Expenses: Not very hard  Food Insecurity: No Food  Insecurity (04/29/2024)   Received from [facility]   Epic    Within the past 12 months, you worried that your food would run out before you got the money to buy more.: Never true    Within the past 12 months, the food you bought just didn't last and you didn't have money to get more.: Never true  Transportation Needs: No Transportation Needs (04/29/2024)   Received from [facility] - Transportation    In the  past 12 months, has lack of transportation kept you from medical appointments or from getting medications?: No    Lack of Transportation (Non-Medical): No  Physical Activity: Not on file  Stress: Not on file  Social Connections: Not on file  Depression (EYV7-0): Not on file  Alcohol Screen: Not on file  Housing: Low Risk  (04/29/2024)   Received from [facility]   Epic    In the last 12 months, was there a time when you were not able to pay the mortgage or rent on time?: No    In the past 12 months, how many times have you moved where you were living?: 0    At any time in the past 12 months, were you homeless or living in a shelter (including now)?: No  Utilities: Not At Risk (04/29/2024)   Received from [facility]   Epic    In the past 12 months has the electric, gas, oil, or water company threatened to shut off services in your home?: No  Health Literacy: Not on file   Tobacco Use History[2] Social History   Substance and Sexual Activity  Alcohol Use Yes   Social History   Substance and Sexual Activity  Drug Use Never    Living Situation: wife  married/; children:     2 stepchildren and 1 bio child                web designer Education: HS grad  current legal issues: denied  Have you used/abused any of the following (include frequency/amt/last use):  a. Tobacco products  Y  amount: vape b. ETOH Y  last drink yesterday;  relapsed   c. Cannabis N   d. Cocaine N   e. Prescription Stimulants N  f. Methamphetamine N   g. Inhalants N   h. Sedative/sleeping pills N   i. Hallucinogens N  j. Street Opioids  Y hx Percocet and hydrocodone per record review k. Prescription opioids N   l. Other: specify (spice, K2, bath salts, etc.)  N   Any history of substance related:  Blackouts:  +   Tremors: +      UDS not available during this encounter   BAL  142      FAMILY HISTORY  History reviewed. No pertinent family  history. Family Psychiatric History (if known):  denied  MENTAL STATUS EXAM (MSE)  Mental Status Exam: General Appearance: Disheveled  Orientation:  Full (Time, Place, and Person)  Memory:  Immediate;   Poor Recent;   Fair Remote;   Fair  Concentration:  Concentration: Fair  Recall:  NA  Attention  Poor  Eye Contact:  Minimal  Speech:  Slow  Language:  Fair  Volume:  Decreased  Mood: depressed  Affect:  Depressed and Flat  Thought Process:  concrete  Thought Content:  Logical  Suicidal Thoughts:  Yes.  with intent/plan  Homicidal Thoughts:  No  Judgement:  Impaired  Insight:  Lacking  Psychomotor Activity:  Psychomotor Retardation  Akathisia:  Negative  Fund of Knowledge:  Fair    Assets:  Biomedical Engineer Social Support Vocational/Educational  Cognition:  Impaired,  Mild  ADL's:  Intact  AIMS (if indicated):       VITALS  Blood pressure (!) 147/117, pulse (!) 108, temperature 98.2 F (36.8 C), temperature source Oral, resp. rate 18, height 5' 9 (1.753 m), weight 99.8 kg, SpO2 97%.  LABS  Admission on 12/11/2024  Component Date Value Ref Range Status   Sodium 12/11/2024 141  135 - 145 mmol/L Final   Potassium 12/11/2024 4.0  3.5 - 5.1 mmol/L Final   Chloride 12/11/2024 102  98 - 111 mmol/L Final   CO2 12/11/2024 25  22 - 32 mmol/L Final   Glucose, Bld 12/11/2024 109 (H)  70 - 99 mg/dL Final   Glucose reference range applies only to samples taken after fasting for at least 8 hours.   BUN 12/11/2024 9  6 - 20 mg/dL Final   Creatinine, Ser 12/11/2024 0.88  0.61 - 1.24 mg/dL Final   Calcium 98/72/7973 10.0  8.9 - 10.3 mg/dL Final   Total Protein 98/72/7973 8.5 (H)  6.5 - 8.1 g/dL Final   Albumin 98/72/7973 5.3 (H)  3.5 - 5.0 g/dL Final   AST 98/72/7973 52 (H)  15 - 41 U/L Final   ALT 12/11/2024 99 (H)  0 - 44 U/L Final   Alkaline Phosphatase 12/11/2024 102  38 - 126 U/L Final   Total Bilirubin 12/11/2024 0.3  0.0 - 1.2 mg/dL Final   GFR,  Estimated 12/11/2024 >60  >60 mL/min Final   Comment: (NOTE) Calculated using the CKD-EPI Creatinine Equation (2021)    Anion gap 12/11/2024 14  5 - 15 Final   Performed at [facility], [address] [place], [place] [address]   Alcohol, Ethyl (B) 12/11/2024 142 (H)  <15 mg/dL Final   Comment: (NOTE) For medical purposes only. Performed at [facility], [address] [place], [place] [address]    WBC 12/11/2024 7.2  4.0 - 10.5 K/uL Final   RBC 12/11/2024 5.29  4.22 - 5.81 MIL/uL Final   Hemoglobin 12/11/2024 15.2  13.0 - 17.0 g/dL Final   HCT 98/72/7973 43.2  39.0 - 52.0 % Final   MCV 12/11/2024 81.7  80.0 - 100.0 fL Final   MCH 12/11/2024 28.7  26.0 - 34.0 pg Final   MCHC 12/11/2024 35.2  30.0 - 36.0 g/dL Final   RDW 98/72/7973 12.1  11.5 - 15.5 % Final   Platelets 12/11/2024 299  150 - 400 K/uL Final   nRBC 12/11/2024 0.0  0.0 - 0.2 % Final   Performed at [facility], [address] [place], [place] [address]   Total hemoglobin 12/12/2024 14.5  12.0 - 16.0 g/dL Final   O2 Saturation 98/71/7973 95  % Final   Carboxyhemoglobin 12/12/2024 2.0 (H)  0.5 - 1.5 % Final   Methemoglobin 12/12/2024 <0.7  0.0 - 1.5 % Final   Comment: CRITICAL RESULT CALLED TO, READ BACK BY AND VERIFIED WITH: QUALE, MD AT 0054 ON 12/12/24 CH[facility],RRT    Total oxygen content 12/12/2024 92.6  % Final   Performed at [facility], [address] [place], [place] [address]   Acetaminophen (Tylenol), Serum 12/11/2024 <10 (L)  10 - 30 ug/mL Final   Comment: (NOTE) Toxic concentrations can be more effectively related to post dose interval; >200, >100, and >  50 ug/mL serum concentrations correspond to toxic concentrations at 4, 8, and 12 hours post dose, respectively.  Performed at [facility], [address] [place], [place] [address]    Salicylate Lvl 12/11/2024 <7.0 (L)  7.0 - 30.0 mg/dL Final   Performed at [facility], [address] [place], [place] [address]    PSYCHIATRIC REVIEW OF SYSTEMS (ROS)  ROS: Notable for the following relevant positive findings: ROS  Additional findings:      Musculoskeletal: No abnormal movements observed      Gait & Station: Laying/Sitting      Pain Screening: Present - mild to moderate      Nutrition & Dental Concerns: no concerns  RISK FORMULATION/ASSESSMENT    Is the patient experiencing any suicidal or homicidal ideations: Yes       Explain if yes: grabbed shotgun to shoot self Protective factors considered for safety management:   Absence of psychosis Access to adequate health care Advice& help seeking Resourcefulness/Survival skills Children Sense of responsibility    Risk factors/concerns considered for safety management:   Depression Substance abuse/dependence Access to lethal means Impulsivity Male gender  Is there a safety management plan with the patient and treatment team to minimize risk factors and promote protective factors: Yes           Explain: safety obs, admit to inpt psychiatry     Is crisis care placement or psychiatric hospitalization recommended: Yes     Based on my current evaluation and risk assessment, patient is determined at this time to be at:  High risk  Global Suicide Risk Assessment:   risk lethality increased under context of drugs/alcohol. Encouraged to abstain  *RISK ASSESSMENT Risk assessment is a dynamic process; it is possible that this patient's condition, and risk level, may change. This should be re-evaluated and managed over time as appropriate. Please re-consult psychiatric consult services if additional assistance is needed in terms of risk assessment and management. If your team decides to discharge this patient, please advise the patient how to best access emergency psychiatric services, or to call 911, if their condition worsens or they feel unsafe in any way.  Dr. Agustin JUDITHANN Ada, PhD, MSN, APRN, PMHNP-BC, MCJ    Cory Vance  KANDICE Ada, NP Telepsychiatry Consult Services     [1] No Known Allergies [2]  Social History Tobacco Use  Smoking Status Never  Smokeless Tobacco Not on file   "

## 2024-12-12 NOTE — ED Notes (Signed)
 Patient accepted to BMU After 8;00 AM

## 2024-12-12 NOTE — ED Provider Notes (Signed)
 Notified by psychiatry team plan is for inpatient psychiatric admission.  Continue IVC   Dicky Anes, MD 12/12/24 [phone]

## 2024-12-12 NOTE — Group Note (Signed)
 Date:  12/12/2024 Time:  9:09 PM  Group Topic/Focus:  Wrap-Up Group:   The focus of this group is to help patients review their daily goal of treatment and discuss progress on daily workbooks.    Participation Level:  Did Not Attend    Cory Vance Servant 12/12/2024, 9:09 PM

## 2024-12-12 NOTE — Discharge Instructions (Signed)

## 2024-12-12 NOTE — BH Assessment (Addendum)
 Comprehensive Clinical Assessment (CCA) Screening, Triage and Referral Note  12/12/2024 Cory Vance [id number] Recommendations for Services/Supports/Treatments: : Consulted with NP Cory  M., who recommended pt for inpatient treatment. Cory Vance is a 29 year old, English speaking, Caucasian male. Pt presented to [facility] ED and is under IVC. Per triage note: Pt brought in by [facility] dept pt is IVC. Pt denies SI or HI pt denies drug use pt reports etoh use. Ivc papers report pt has been drinking and was going to shoot himself.  On assessment, the pt. admitted that he relapsed on alcohol; last use was prior to arrival 12/11/24. The pt expressed being unclear on what prompted recent relapse. Pt admitted to having confusion, attributing his challenges to having just awakened. The pt reported that he doesn't remember events prior to arrival. Pt unable to identify specific problems, triggers or life stressors. Pt initially denied having symptoms of depression; however, pt later admitted to feeling a little depressed. Pt reported that he is prescribed medications for his depression and that he'd skipped 2 doses. The pt. had poor insight and impaired judgement. Pt was lethargic. Pt's BAL was 142 on arrival; UDS is pending. Pt denied having trauma or family history of mental illness. Pt denied current SI/HI/AV/H.   Chief Complaint  Patient presents with   Psychiatric Evaluation   Visit Diagnosis: Alcohol use disorder  Patient Reported Information How did you hear about us ? No data recorded What Is the Reason for Your Visit/Call Today? No data recorded How Long Has This Been Causing You Problems? No data recorded What Do You Feel Would Help You the Most Today? No data recorded  Have You Recently Had Any Thoughts About Hurting Yourself? No data recorded Are You Planning to Commit Suicide/Harm Yourself At This time? No data recorded  Have you Recently Had Thoughts About Hurting Someone Cory Vance?  No data recorded Are You Planning to Harm Someone at This Time? No data recorded Explanation: No data recorded  Have You Used Any Alcohol or Drugs in the Past 24 Hours? No data recorded How Long Ago Did You Use Drugs or Alcohol? No data recorded What Did You Use and How Much? No data recorded  Do You Currently Have a Therapist/Psychiatrist? No data recorded Name of Therapist/Psychiatrist: No data recorded  Have You Been Recently Discharged From Any Office Practice or Programs? No data recorded Explanation of Discharge From Practice/Program: No data recorded   CCA Screening Triage Referral Assessment Type of Contact: No data recorded Telemedicine Service Delivery:   Is this Initial or Reassessment?   Date Telepsych consult ordered in CHL:    Time Telepsych consult ordered in CH[facility]:    Location of Assessment: No data recorded Provider Location: No data recorded   Collateral Involvement: No data recorded  Does Patient Have a Court Appointed Legal Guardian? No data recorded Name and Contact of Legal Guardian: No data recorded If Minor and Not Living with Parent(s), Who has Custody? No data recorded Is CPS involved or ever been involved? No data recorded Is APS involved or ever been involved? No data recorded  Patient Determined To Be At Risk for Harm To Self or Others Based on Review of Patient Reported Information or Presenting Complaint? No data recorded Method: No data recorded Availability of Means: No data recorded Intent: No data recorded Notification Required: No data recorded Additional Information for Danger to Others Potential: No data recorded Additional Comments for Danger to Others Potential: No data recorded Are There Guns or Other Weapons in  Your Home? No data recorded Types of Guns/Weapons: No data recorded Are These Weapons Safely Secured?                            No data recorded Who Could Verify You Are Able To Have These Secured: No data recorded Do You Have  any Outstanding Charges, Pending Court Dates, Parole/Probation? No data recorded Contacted To Inform of Risk of Harm To Self or Others: No data recorded  Does Patient Present under Involuntary Commitment? No data recorded   [place] of Residence: No data recorded  Patient Currently Receiving the Following Services: No data recorded  Determination of Need: No data recorded  Options For Referral: No data recorded  Disposition Recommendation per psychiatric provider: Inpatient treatment  Amiya Escamilla R Daliyah Sramek, LCAS

## 2024-12-12 NOTE — ED Notes (Signed)
 Report given to Melissa in the BMU at this time.

## 2024-12-13 LAB — URINE DRUG SCREEN
Amphetamines: NEGATIVE
Barbiturates: NEGATIVE
Benzodiazepines: POSITIVE — AB
Cocaine: NEGATIVE
Fentanyl: NEGATIVE
Methadone Scn, Ur: NEGATIVE
Opiates: NEGATIVE
Tetrahydrocannabinol: NEGATIVE

## 2024-12-13 MED ORDER — BUPRENORPHINE HCL-NALOXONE HCL 8-2 MG SL SUBL
1.0000 | SUBLINGUAL_TABLET | Freq: Every day | SUBLINGUAL | Status: DC
  Administered 2024-12-13 – 2024-12-14 (×2): 1 via SUBLINGUAL
  Filled 2024-12-13 (×2): qty 1

## 2024-12-13 NOTE — Group Note (Signed)
 Date:  12/13/2024 Time:  9:22 PM  Group Topic/Focus:  Wrap-Up Group:   The focus of this group is to help patients review their daily goal of treatment and discuss progress on daily workbooks.    Participation Level:  Did Not Attend  Participation Quality:  none  Affect:  none  Cognitive:  none  Insight: None  Engagement in Group:  None  Modes of Intervention:  none  Additional Comments:    Ginny JONETTA Galeazzi 12/13/2024, 9:22 PM
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
## 2024-12-13 NOTE — Plan of Care (Signed)
   Problem: Education: Goal: Emotional status will improve Outcome: Progressing Goal: Mental status will improve Outcome: Progressing

## 2024-12-13 NOTE — Group Note (Signed)
 Recreation Therapy Group Note   Group Topic:Healthy Support Systems  Group Date: 12/13/2024 Start Time: 1000 End Time: 1040 Facilitators: Celestia Jeoffrey BRAVO, LRT, CTRS Location: Craft Room  Group Description: Straw Bridge. In groups or individually, patients were given 10 plastic drinking straws and an equal length of masking tape. Using the materials provided, patients were instructed to build a free-standing bridge-like structure to suspend an everyday item (ex: deck of cards) off the floor or table surface. All materials were required to be used in secondary school teacher. LRT facilitated post-activity discussion reviewing the importance of having strong and healthy support systems in our lives. LRT discussed how the people in our lives serve as the tape and the deck of cards we placed on top of our straw structure are the stressors we face in daily life. LRT and pts discussed what happens in our life when things get too heavy for us , and we don't have strong supports outside of the hospital. Pt shared 2 of their healthy supports in their life aloud in the group.   Goal Area(s) Addressed:  Patient will identify 2 healthy supports in their life. Patient will identify skills to successfully complete activity. Patient will identify correlation of this activity to life post-discharge.  Patient will build on frustration tolerance skills. Patient will increase team building and communication skills.    Affect/Mood: N/A   Participation Level: Did not attend    Clinical Observations/Individualized Feedback: Patient did not attend.  Plan: Continue to engage patient in RT group sessions 2-3x/week.   Jeoffrey BRAVO Celestia, LRT, CTRS 12/13/2024 10:56 AM

## 2024-12-13 NOTE — Progress Notes (Signed)
 Pt calm and pleasant during assessment denying SI/HI/AVH. Pt observed interacting appropriately with staff and peers on the unit. Pt compliant with medication administration. Pt given education, support, and encouragement to be active in his treatment plan. Pt being monitored Q 15 minutes for safety per unit protocol, remains safe on the unit

## 2024-12-13 NOTE — Group Note (Signed)
 Date:  12/13/2024 Time:  9:40 AM  Group Topic/Focus:  Activity Group: The focus of the group is to encourage the patients to go outside to the courtyard and get some fresh air and some exercise.   Participation Level:  Did Not Attend   Cory Vance 12/13/2024, 9:40 AM

## 2024-12-13 NOTE — BHH Counselor (Signed)
 CSW contacted [facility] in [place] [phone]) regarding pt dosing and continued services. CSW spoke with [facility] who confirmed that they have no plans to discontinue his services. She also confirmed that pt is on a dose of one, 8-5mg  film daily. No other concerns expressed. Contact ended without incident.   Nadara SAUNDERS. Chaim, MSW, LCSW, LCAS 12/13/2024 10:44 AM

## 2024-12-13 NOTE — [facility] Suicide Risk Assessment (Signed)
 [facility] INPATIENT:  Family/Significant Other Suicide Prevention Education  Suicide Prevention Education:  Education Completed; Cory  Vance/Cory Vance [phone]), has been identified by the patient as the family member/significant other with whom the patient will be residing, and identified as the person(s) who will aid the patient in the event of a mental health crisis (suicidal ideations/suicide attempt).  With written consent from the patient, the family member/significant other has been provided the following suicide prevention education, prior to the and/or following the discharge of the patient.  The suicide prevention education provided includes the following: Suicide risk factors Suicide prevention and interventions National Suicide Hotline telephone number [facility] assessment telephone number [facility] Emergency Assistance 911 [facility] and/or Residential Mobile Crisis Unit telephone number  Request made of family/significant other to: Remove weapons (e.g., guns, rifles, knives), all items previously/currently identified as safety concern.   Remove drugs/medications (over-the-counter, prescriptions, illicit drugs), all items previously/currently identified as a safety concern.  The family member/significant other verbalizes understanding of the suicide prevention education information provided.  The family member/significant other agrees to remove the items of safety concern listed above.  Cory Vance shared that she called the cops on the pt because he was a danger to himself. She stated that he was drunk and belligerent, and is in active addiction. Cory Vance shared that pt had a gun and was threatening to kill himself. She stated that pt was a danger to himself and although she does not feel like he would have hurt her, she cannot be sure of that. Cory Vance confirmed that gun could be secured but also stated that pt would not be returning to her home. She stated that pt would be  going to stay in a travel camper on his family's land upon discharge. No other concerns expressed. Contact ended without incident.   Cory Vance Fam 12/13/2024, 10:51 AM

## 2024-12-13 NOTE — Progress Notes (Signed)
 Pt affect and mood improved after receiving Suboxone  this am and he reported feeling much better, showered and groomed well. No further complaints verbalized.

## 2024-12-13 NOTE — [facility] Counselor (Signed)
 Adult Comprehensive Assessment  Patient ID: Cory Vance, male   DOB: [birth year], 29 y.o.   MRN: [id number]  Information Source: Information source: Patient  Current Stressors:  Patient states their primary concerns and needs for treatment are:: I don't drink like that. I had a night where I got drunk. I guess I made some statements about ending my life. He stated that wife sent him to the hospital. Patient states their goals for this hospitilization and ongoing recovery are:: Getting back straight. 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 Bereavement / Loss: None reported  Living/Environment/Situation:  Living Arrangements: Spouse/significant other, Children Living conditions (as described by patient or guardian): I live in [place]. It's good really. Who else lives in the home?: My wife and three kids. How long has patient lived in current situation?: Like four to five years. What is atmosphere in current home: Comfortable, Loving, Supportive, Other (Comment)  Family History:  Marital status: Married Number of Years Married: 3 What types of issues is patient dealing with in the relationship?: Pt denied Are you sexually active?: Yes What is your sexual orientation?: Straight. Does patient have children?: Yes How many children?: 3 How is patient's relationship with their children?: It's good.  Childhood History:  By whom was/is the patient raised?: Both parents Additional childhood history information: Pretty normal. Description of patient's relationship with caregiver when they were a child: It was  good. Patient's description of current relationship with people who raised him/her: It's good. How were you disciplined when you got in trouble as a child/adolescent?: Usually got spanked. Does patient have siblings?: Yes Number of Siblings: 5 Description of patient's current relationship with siblings: It's good. 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 How Long has Patient Been Employed?: Two to three years. Are You Satisfied With Your Job?: Yes Do You Work More Than One Job?: No Patient's Job has Been Impacted by Current Illness: No What is the Longest Time Patient has Held a Job?: Five years. Where was the Patient Employed at that Time?: [employer] in [place]. Has Patient ever Been in the [employer]?: No  Financial Resources:   Financial resources: Income from employment, Private insurance Does patient have a representative payee or guardian?: No  Alcohol/Substance Abuse:   What has been your use of drugs/alcohol within the last 12 months?: Pt reported that he has been using kratom daily (400-500mg ). He shared that he recently started taking this. He stated that he is on 8-5mg  suboxone  daily as well. He denied any other substance use. If attempted suicide, did drugs/alcohol play a role in this?: No Alcohol/Substance Abuse Treatment Hx: Past Tx, Outpatient If yes, describe treatment  and response: [facility] in [place]  Is patient motivated for change?: Yes Does patient live in an environment that promotes recovery or serves as an obstacle to recovery?: No Are others in the home using alcohol or other substances?: No Are significant others in the home willing to participate in the patient's care?: Yes Describe significant others willing to participate in the patient's care: Wife and mother Has alcohol/substance abuse ever caused legal problems?: No  Social Support System:   Patient's Community Support System: Good Describe Community Support System: My wife, my mom. Type of faith/religion: Christian. How does patient's faith help to cope with current illness?: Pretty normal.  Leisure/Recreation:   Do You Have Hobbies?: No  Strengths/Needs:   What is the patient's perception of their strengths?: No, not really. Patient states these barriers may affect/interfere with their treatment: Pt denied any barriers. Patient states these barriers may affect their return to the community: Pt denied any barriers.  Discharge Plan:   Currently receiving community mental health services: Yes (From Whom) ([facility] for suboxone  maintenance.) Patient states concerns and preferences for aftercare planning are: Pt would like to continue with [facility]. Patient states they will know when they are safe and ready for discharge when: I think as long as I can get my medicine I'll be fine. Does patient have access to transportation?: Yes Does patient have financial barriers related to discharge medications?: No Will patient be returning to same living situation after discharge?: Yes  Summary/Recommendations:   Summary and Recommendations (to be completed by the evaluator): Patient is a 29 year old, married, male [place] [facility] He reported that he came into the hospital because he got drunk and made some statements about ending  his life. Pt stated that his wife sent him to the hospital. He shared that his goal is getting back straight. Pt lives at home with his wife and three children, where he plans to return upon discharge. He denied any stressors in his life. Pt also denied any history of trauma or abuse. Pt reported that he is on suboxone  maintenance and receives one 8-5mg  film of suboxone  daily. He endorsed use of kratom (400-500mg ) daily, stating that he began using this recently. Pt denied any other substance use. He denied any current outpatient mental health services or desire to have any scheduled. Pt plans to continue his maintenance dosing upon discharge with [facility] in [place]. Recommendations include: crisis stabilization, therapeutic milieu, encourage group attendance and participation, medication management for detox/mood stabilization and development of a comprehensive mental wellness/sobriety plan.  Nadara JONELLE Fam. 12/13/2024

## 2024-12-13 NOTE — Plan of Care (Signed)
" °  Problem: Education: Goal: Emotional status will improve Outcome: Not Met (add Reason) Goal: Mental status will improve Outcome: Not Met (add Reason) Goal: Verbalization of understanding the information provided will improve Outcome: Progressing   Problem: Activity: Goal: Interest or engagement in activities will improve Outcome: Progressing   Problem: Coping: Goal: Ability to verbalize frustrations and anger appropriately will improve Outcome: Not Met (add Reason)   Problem: Safety: Goal: Periods of time without injury will increase Outcome: Progressing   "

## 2024-12-13 NOTE — Group Note (Signed)
 Recreation Therapy Group Note   Group Topic:Stress Management  Group Date: 12/13/2024 Start Time: 1530 End Time: 1545 Facilitators: Celestia Jeoffrey BRAVO, LRT, CTRS Location: Dayroom  Group Description: PMR (Progressive Muscle Relaxation). LRT educates patients on what PMR is and the benefits that come from it. Patients are asked to sit with their feet flat on the floor while sitting up and all the way back in their chair, if possible. LRT and pts follow a prompt through a speaker that requires you to tense and release different muscles in their body and focus on their breathing. During session, lights are off and soft music is being played. Pts are given a stress ball to use if needed.   Goal Area(s) Addressed:  Patients will be able to describe progressive muscle relaxation.  Patient will practice using relaxation technique. Patient will identify a new coping skill.  Patient will follow multistep directions to reduce anxiety and stress.   Affect/Mood: N/A   Participation Level: Did not attend    Clinical Observations/Individualized Feedback: Patient did not attend.  Plan: Continue to engage patient in RT group sessions 2-3x/week.   Jeoffrey BRAVO Celestia, LRT, CTRS 12/13/2024 4:39 PM

## 2024-12-13 NOTE — Group Note (Signed)
"                                                 [facility] LCSW Group Therapy Note    Group Date: 12/13/2024 Start Time: 1300 End Time: 1400  Type of Therapy and Topic:  Group Therapy:  Overcoming Obstacles  Participation Level:  [facility] PARTICIPATION LEVEL: None  Mood:  Description of Group:   In this group patients will be encouraged to explore what they see as obstacles to their own wellness and recovery. They will be guided to discuss their thoughts, feelings, and behaviors related to these obstacles. The group will process together ways to cope with barriers, with attention given to specific choices patients can make. Each patient will be challenged to identify changes they are motivated to make in order to overcome their obstacles. This group will be process-oriented, with patients participating in exploration of their own experiences as well as giving and receiving support and challenge from other group members.  Therapeutic Goals: 1. Patient will identify personal and current obstacles as they relate to admission. 2. Patient will identify barriers that currently interfere with their wellness or overcoming obstacles.  3. Patient will identify feelings, thought process and behaviors related to these barriers. 4. Patient will identify two changes they are willing to make to overcome these obstacles:    Summary of Patient Progress Patient attended group, however did not engage in group discussions.    Therapeutic Modalities:   Cognitive Behavioral Therapy Solution Focused Therapy Motivational Interviewing Relapse Prevention Therapy   Sherryle JINNY Margo, LCSW "

## 2024-12-13 NOTE — Progress Notes (Signed)
" °   12/13/24 1000  Psych Admission Type (Psych Patients Only)  Admission Status Involuntary  Psychosocial Assessment  Patient Complaints Irritability;Worrying  Eye Contact Fair  Facial Expression Flat  Affect Anxious;Sad  Speech Logical/coherent  Interaction Minimal  Motor Activity Slow  Appearance/Hygiene In scrubs  Behavior Characteristics Anxious;Other (Comment) (c/o cold-flu-like symptoms)  Mood Depressed  Thought Process  Coherency WDL  Content WDL  Delusions None reported or observed  Perception WDL  Hallucination None reported or observed  Judgment Impaired  Confusion WDL  Danger to Self  Current suicidal ideation? Denies  Danger to Others  Danger to Others None reported or observed    "

## 2024-12-13 NOTE — Group Note (Signed)
 Date:  12/13/2024 Time:  12:54 PM  Group Topic/Focus:  Coping With Mental Health Crisis:   The purpose of this group is to help patients identify strategies for coping with mental health crisis.  Group discusses possible causes of crisis and ways to manage them effectively. MHT also lead a craft activity upon patient request.    Participation Level:  Did Not Attend  Leigh VEAR Pais 12/13/2024, 12:54 PM

## 2024-12-14 MED ORDER — BUPRENORPHINE HCL-NALOXONE HCL 2-0.5 MG SL SUBL
2.0000 | SUBLINGUAL_TABLET | Freq: Two times a day (BID) | SUBLINGUAL | Status: DC
  Administered 2024-12-15 – 2024-12-19 (×9): 2 via SUBLINGUAL
  Filled 2024-12-14 (×9): qty 2

## 2024-12-14 NOTE — Group Note (Signed)
 Recreation Therapy Group Note   Group Topic:Relaxation  Group Date: 12/14/2024 Start Time: 1040 End Time: 1120 Facilitators: Celestia Jeoffrey BRAVO, LRT, CTRS Location: Dayroom  Group Description: Meditation. LRT and patients discussed what they know about meditation and mindfulness. LRT played a Deep Breathing Meditation exercise script for patients to follow along to. LRT and patients discussed how meditation and deep breathing can be used as a coping skill post--discharge to help manage symptoms of stress.   Goal Area(s) Addressed: Patient will practice using relaxation technique. Patient will identify a new coping skill.  Patient will follow multistep directions to reduce anxiety and stress.   Affect/Mood: Appropriate   Participation Level: Active and Engaged   Participation Quality: Independent   Behavior: Calm and Cooperative   Speech/Thought Process: Coherent   Insight: Good   Judgement: Good   Modes of Intervention: Activity, Education, and Exploration   Patient Response to Interventions:  Attentive, Engaged, Interested , and Receptive   Education Outcome:  Acknowledges education   Clinical Observations/Individualized Feedback: [name] was active in their participation of session activities and group discussion. Pt completed all exercises as prompted.    Plan: Continue to engage patient in RT group sessions 2-3x/week.   Jeoffrey BRAVO Celestia, LRT, CTRS 12/14/2024 11:43 AM

## 2024-12-14 NOTE — Group Note (Signed)
 Recreation Therapy Group Note   Group Topic:Leisure Education  Group Date: 12/14/2024 Start Time: 1530 End Time: 1620 Facilitators: Celestia Jeoffrey FORBES ARTICE, CTRS Location: Craft Room  Group Description: Leisure. Patients were given the option to choose from journaling, coloring, drawing, making origami, playing with playdoh, listening to music or singing karaoke. LRT and pts discussed the meaning of leisure, the importance of participating in leisure during their free time/when they're outside of the hospital, as well as how our leisure interests can also serve as coping skills.   Goal Area(s) Addressed:  Patient will identify a current leisure interest.  Patient will learn the definition of leisure. Patient will practice making a positive decision. Patient will have the opportunity to try a new leisure activity. Patient will communicate with peers and LRT.    Affect/Mood: Appropriate   Participation Level: Active and Engaged   Participation Quality: Independent   Behavior: Calm and Cooperative   Speech/Thought Process: Coherent   Insight: Fair   Judgement: Fair    Modes of Intervention: Art, Activity, Clarification, Education, Exploration, and Music   Patient Response to Interventions:  Attentive, Engaged, Interested , and Receptive   Education Outcome:  Acknowledges education   Clinical Observations/Individualized Feedback: [name] was active in their participation of session activities and group discussion. Pt identified lead AA meetings and reading as things he does in his free time.    Plan: Continue to engage patient in RT group sessions 2-3x/week.   Jeoffrey FORBES Celestia, LRT, CTRS 12/14/2024 5:08 PM

## 2024-12-14 NOTE — Progress Notes (Signed)
" °   12/14/24 1100  Psych Admission Type (Psych Patients Only)  Admission Status Involuntary  Psychosocial Assessment  Patient Complaints Anxiety  Eye Contact Fair  Facial Expression Flat  Affect Anxious;Sad  Speech Logical/coherent  Interaction Minimal  Motor Activity Slow  Appearance/Hygiene In scrubs  Behavior Characteristics Appropriate to situation  Mood Depressed  Thought Process  Coherency WDL  Content WDL  Delusions None reported or observed  Perception WDL  Hallucination None reported or observed  Judgment Impaired  Confusion WDL  Danger to Self  Current suicidal ideation? Denies  Danger to Others  Danger to Others None reported or observed    "

## 2024-12-14 NOTE — BH IP Treatment Plan (Signed)
 Interdisciplinary Treatment and Diagnostic Plan Update  12/14/2024 Time of Session: 10:13AM Cory Vance MRN: [id number]  Principal Diagnosis: Alcohol use disorder  Secondary Diagnoses: Principal Problem:   Alcohol use disorder   Current Medications:  Current Facility-Administered Medications  Medication Dose Route Frequency Provider Last Rate Last Admin   buprenorphine -naloxone  (SUBOXONE ) 8-2 mg per SL tablet 1 tablet  1 tablet Sublingual Daily May, Tanya, NP   1 tablet at 12/14/24 9244   dicyclomine  (BENTYL ) tablet 20 mg  20 mg Oral Q6H PRN May, Tanya, NP       haloperidol  (HALDOL ) tablet 5 mg  5 mg Oral TID PRN Onuoha, Chinwendu V, NP       And   diphenhydrAMINE  (BENADRYL ) capsule 50 mg  50 mg Oral TID PRN Onuoha, Chinwendu V, NP       haloperidol  lactate (HALDOL ) injection 5 mg  5 mg Intramuscular TID PRN Onuoha, Chinwendu V, NP       And   diphenhydrAMINE  (BENADRYL ) injection 50 mg  50 mg Intramuscular TID PRN Onuoha, Chinwendu V, NP       And   LORazepam  (ATIVAN ) injection 2 mg  2 mg Intramuscular TID PRN Onuoha, Chinwendu V, NP       haloperidol  lactate (HALDOL ) injection 10 mg  10 mg Intramuscular TID PRN Onuoha, Chinwendu V, NP       And   diphenhydrAMINE  (BENADRYL ) injection 50 mg  50 mg Intramuscular TID PRN Onuoha, Chinwendu V, NP       And   LORazepam  (ATIVAN ) injection 2 mg  2 mg Intramuscular TID PRN Onuoha, Chinwendu V, NP       hydrOXYzine  (ATARAX ) tablet 25 mg  25 mg Oral Q6H PRN Onuoha, Chinwendu V, NP   25 mg at 12/13/24 2102   loperamide  (IMODIUM ) capsule 2-4 mg  2-4 mg Oral PRN Onuoha, Chinwendu V, NP       methocarbamol  (ROBAXIN ) tablet 500 mg  500 mg Oral Q8H PRN May, Tanya, NP       multivitamin with minerals tablet 1 tablet  1 tablet Oral Daily Onuoha, Chinwendu V, NP   1 tablet at 12/14/24 0755   naproxen  (NAPROSYN ) tablet 500 mg  500 mg Oral BID PRN May, Tanya, NP       nicotine  (NICODERM CQ  - dosed in mg/24 hours) patch 14 mg  14 mg Transdermal Daily  Jadapalle, Sree, MD   14 mg at 12/14/24 0755   ondansetron  (ZOFRAN -ODT) disintegrating tablet 4 mg  4 mg Oral Q6H PRN Onuoha, Chinwendu V, NP       QUEtiapine  (SEROQUEL ) tablet 50 mg  50 mg Oral QHS Trudy Carwin, NP   50 mg at 12/13/24 2102   PTA Medications: Medications Prior to Admission  Medication Sig Dispense Refill Last Dose/Taking   ARIPiprazole (ABILIFY) 5 MG tablet Take 5 mg by mouth daily.      Buprenorphine  HCl-Naloxone  HCl 8-2 MG FILM Place 1 Film under the tongue daily.      buPROPion (WELLBUTRIN XL) 150 MG 24 hr tablet Take 150 mg by mouth daily.      QUEtiapine  (SEROQUEL ) 50 MG tablet Take 50 mg by mouth at bedtime.      venlafaxine XR (EFFEXOR-XR) 75 MG 24 hr capsule Take 225 mg by mouth daily.       Patient Stressors:    Patient Strengths:    Treatment Modalities: Medication Management, Group therapy, Case management,  1 to 1 session with clinician, Psychoeducation, Recreational therapy.   Physician  Treatment Plan for Primary Diagnosis: Alcohol use disorder Long Term Goal(s): Improvement in symptoms so as ready for discharge   Short Term Goals: Ability to identify changes in lifestyle to reduce recurrence of condition will improve Ability to verbalize feelings will improve Ability to disclose and discuss suicidal ideas Ability to demonstrate self-control will improve Ability to identify and develop effective coping behaviors will improve Ability to maintain clinical measurements within normal limits will improve Compliance with prescribed medications will improve Ability to identify triggers associated with substance abuse/mental health issues will improve  Medication Management: Evaluate patient's response, side effects, and tolerance of medication regimen.  Therapeutic Interventions: 1 to 1 sessions, Unit Group sessions and Medication administration.  Evaluation of Outcomes: Not Met  Physician Treatment Plan for Secondary Diagnosis: Principal Problem:    Alcohol use disorder  Long Term Goal(s): Improvement in symptoms so as ready for discharge   Short Term Goals: Ability to identify changes in lifestyle to reduce recurrence of condition will improve Ability to verbalize feelings will improve Ability to disclose and discuss suicidal ideas Ability to demonstrate self-control will improve Ability to identify and develop effective coping behaviors will improve Ability to maintain clinical measurements within normal limits will improve Compliance with prescribed medications will improve Ability to identify triggers associated with substance abuse/mental health issues will improve     Medication Management: Evaluate patient's response, side effects, and tolerance of medication regimen.  Therapeutic Interventions: 1 to 1 sessions, Unit Group sessions and Medication administration.  Evaluation of Outcomes: Not Met   RN Treatment Plan for Primary Diagnosis: Alcohol use disorder Long Term Goal(s): Knowledge of disease and therapeutic regimen to maintain health will improve  Short Term Goals: Ability to demonstrate self-control, Ability to participate in decision making will improve, Ability to verbalize feelings will improve, Ability to disclose and discuss suicidal ideas, Ability to identify and develop effective coping behaviors will improve, and Compliance with prescribed medications will improve  Medication Management: RN will administer medications as ordered by provider, will assess and evaluate patient's response and provide education to patient for prescribed medication. RN will report any adverse and/or side effects to prescribing provider.  Therapeutic Interventions: 1 on 1 counseling sessions, Psychoeducation, Medication administration, Evaluate responses to treatment, Monitor vital signs and CBGs as ordered, Perform/monitor CIWA, COWS, AIMS and Fall Risk screenings as ordered, Perform wound care treatments as ordered.  Evaluation of  Outcomes: Not Met   LCSW Treatment Plan for Primary Diagnosis: Alcohol use disorder Long Term Goal(s): Safe transition to appropriate next level of care at discharge, Engage patient in therapeutic group addressing interpersonal concerns.  Short Term Goals: Engage patient in aftercare planning with referrals and resources, Increase social support, Increase ability to appropriately verbalize feelings, Increase emotional regulation, Facilitate acceptance of mental health diagnosis and concerns, Facilitate patient progression through stages of change regarding substance use diagnoses and concerns, Identify triggers associated with mental health/substance abuse issues, and Increase skills for wellness and recovery  Therapeutic Interventions: Assess for all discharge needs, 1 to 1 time with Social worker, Explore available resources and support systems, Assess for adequacy in community support network, Educate family and significant other(s) on suicide prevention, Complete Psychosocial Assessment, Interpersonal group therapy.  Evaluation of Outcomes: Not Met   Progress in Treatment: Attending groups: Yes. Participating in groups: No. Taking medication as prescribed: Yes. Toleration medication: Yes. Family/Significant other contact made: Yes, individual(s) contacted:  SPE completed with the patient's wife.  Patient understands diagnosis: Yes. Discussing patient identified problems/goals with  staff: Yes. Medical problems stabilized or resolved: Yes. Denies suicidal/homicidal ideation: Yes. Issues/concerns per patient self-inventory: No. Other: none  New problem(s) identified: No, Describe:  none  New Short Term/Long Term Goal(s): detox, elimination of symptoms of psychosis, medication management for mood stabilization; elimination of SI thoughts; development of comprehensive mental wellness/sobriety plan.   Patient Goals:  to not be on [place]  Discharge Plan or Barriers: CSW to assist hte  patient in development of appropriate discharge plans. Unclear if the patient can return to his home or not.   Reason for Continuation of Hospitalization: Anxiety Depression Medication stabilization Suicidal ideation Withdrawal symptoms  Estimated Length of Stay:  1-7 days  Last 3 [place] Suicide Severity Risk Score: Flowsheet Row Admission (Current) from 12/12/2024 in [facility] ED from 12/11/2024 in [facility] Emergency Department at [facility] from 05/06/2023 in [facility] Emergency Department at [facility]  C-SSRS RISK CATEGORY No Risk No Risk No Risk    Last PHQ 2/9 Scores:     No data to display          Scribe for Treatment Team: Sherryle JINNY Margo, LCSW 12/14/2024 10:58 AM

## 2024-12-14 NOTE — Group Note (Signed)
 Date:  12/14/2024 Time:  1:07 PM  Group Topic/Focus:  Early Warning Signs:   The focus of this group is to help patients identify signs or symptoms they exhibit before slipping into an unhealthy state or crisis.    Participation Level:  Active  Participation Quality:  Appropriate  Affect:  Appropriate  Cognitive:  Appropriate  Insight: Appropriate  Engagement in Group:  Engaged  Modes of Intervention:  Activity  Additional Comments:    Camellia HERO Lynton Crescenzo 12/14/2024, 1:07 PM

## 2024-12-14 NOTE — Group Note (Signed)
 Date:  12/14/2024 Time:  10:11 PM  Group Topic/Focus:  Wrap-Up Group:   The focus of this group is to help patients review their daily goal of treatment and discuss progress on daily workbooks.    Participation Level:  Active  Participation Quality:  Appropriate, Attentive, Sharing, and Supportive  Affect:  Appropriate  Cognitive:  Appropriate  Insight: Appropriate and Good  Engagement in Group:  Engaged  Modes of Intervention:  Discussion  Additional Comments:     Kerri Katz 12/14/2024, 10:11 PM

## 2024-12-14 NOTE — Plan of Care (Signed)
   Problem: Education: Goal: Emotional status will improve Outcome: Progressing Goal: Mental status will improve Outcome: Progressing

## 2024-12-14 NOTE — Progress Notes (Signed)
 SPIRITUAL CARE AND COUNSELING CONSULT NOTE   VISIT SUMMARY Initial visit for spiritual/emotional support while rounding on unit. Chaplains consulted with nurse team. Chaplains participated in group session.   SPIRITUAL ENCOUNTER                                                                                                                                                                      Type of Visit: Initial Care provided to:: Patient Conversation partners present during encounter: Nurse, Other (comment) (chaplain, patients) Referral source: Chaplain assessment Reason for visit: Routine spiritual support OnCall Visit: No   SPIRITUAL FRAMEWORK  Presenting Themes: Goals in life/care, Coping tools Community/Connection: Friend(s) Patient Stress Factors: Health changes Family Stress Factors: None identified   GOALS   Self/Personal Goals: healing Clinical Care Goals: healing   INTERVENTIONS   Spiritual Care Interventions Made: Compassionate presence, Encouragement    INTERVENTION OUTCOMES   Outcomes: Connection to spiritual care, Awareness of support  SPIRITUAL CARE PLAN   Spiritual Care Issues Still Outstanding: Cory Vance will continue to follow    If immediate needs arise, please contact [facility] 24 hour on call [phone]   Barabara Chess, Chaplain  12/14/2024 4:56 PM

## 2024-12-14 NOTE — Plan of Care (Signed)
?  Problem: Education: ?Goal: Knowledge of [facility] Education information/materials will improve ?Outcome: Progressing ?Goal: Emotional status will improve ?Outcome: Progressing ?Goal: Mental status will improve ?Outcome: Progressing ?Goal: Verbalization of understanding the information provided will improve ?Outcome: Progressing ?  ?Problem: Activity: ?Goal: Interest or engagement in activities will improve ?Outcome: Progressing ?  ?Problem: Safety: ?Goal: Periods of time without injury will increase ?Outcome: Progressing ?  ?

## 2024-12-14 NOTE — [facility] Group Notes (Signed)
 Spirituality Group   Group Goal: Support / Education around grief and loss   Secondary Goal: Self compassion and strength awareness  Group Description: Following introductions and group rules, group members engaged in facilitated group dialog and support around topic of loss, with particular support around experiences of loss in their lives. Group members identified types of loss (relationships / self / things) as well as patterns, circumstances, and changes that precipitate loss. Reflection invited on thoughts / feelings around loss, normalized grief responses, and recognized variety in grief experience. Group noted Worden's four tasks of grief in discussion. Group drew on Adlerian / Rogerian, narrative, MI, with [place] group therapy as a primary framework.   Observations: [name] was reserved but still engaged in group discussion, mostly responded when invited.  Avrey Hyser L. Delores HERO.Div

## 2024-12-15 MED ORDER — NICOTINE POLACRILEX 2 MG MT GUM
2.0000 mg | CHEWING_GUM | OROMUCOSAL | Status: DC | PRN
  Administered 2024-12-15 – 2024-12-18 (×8): 2 mg via ORAL
  Filled 2024-12-15 (×8): qty 1

## 2024-12-15 MED ORDER — ESCITALOPRAM OXALATE 10 MG PO TABS
5.0000 mg | ORAL_TABLET | Freq: Every day | ORAL | Status: DC
  Administered 2024-12-16: 5 mg via ORAL
  Filled 2024-12-15: qty 1

## 2024-12-15 NOTE — Progress Notes (Signed)
" °   12/15/24 0212  Psych Admission Type (Psych Patients Only)  Admission Status Involuntary  Psychosocial Assessment  Patient Complaints Anxiety  Eye Contact Fair  Facial Expression Flat  Affect Anxious;Sad  Speech Logical/coherent  Interaction Minimal  Motor Activity Slow  Appearance/Hygiene In scrubs  Behavior Characteristics Appropriate to situation  Mood Depressed  Thought Process  Coherency WDL  Content WDL  Delusions None reported or observed  Perception WDL  Hallucination None reported or observed  Judgment Impaired  Confusion WDL  Danger to Self  Current suicidal ideation? Denies    "

## 2024-12-15 NOTE — Plan of Care (Signed)
 Cory Vance is a 29 y.o. male patient. No diagnosis found. History reviewed. No pertinent past medical history. Current Facility-Administered Medications  Medication Dose Route Frequency Provider Last Rate Last Admin   buprenorphine -naloxone  (SUBOXONE ) 2-0.5 mg per SL tablet 2 tablet  2 tablet Sublingual BID May, Tanya, NP       dicyclomine  (BENTYL ) tablet 20 mg  20 mg Oral Q6H PRN May, Tanya, NP       haloperidol  (HALDOL ) tablet 5 mg  5 mg Oral TID PRN Onuoha, Chinwendu V, NP       And   diphenhydrAMINE  (BENADRYL ) capsule 50 mg  50 mg Oral TID PRN Onuoha, Chinwendu V, NP       haloperidol  lactate (HALDOL ) injection 5 mg  5 mg Intramuscular TID PRN Onuoha, Chinwendu V, NP       And   diphenhydrAMINE  (BENADRYL ) injection 50 mg  50 mg Intramuscular TID PRN Onuoha, Chinwendu V, NP       And   LORazepam  (ATIVAN ) injection 2 mg  2 mg Intramuscular TID PRN Onuoha, Chinwendu V, NP       haloperidol  lactate (HALDOL ) injection 10 mg  10 mg Intramuscular TID PRN Onuoha, Chinwendu V, NP       And   diphenhydrAMINE  (BENADRYL ) injection 50 mg  50 mg Intramuscular TID PRN Onuoha, Chinwendu V, NP       And   LORazepam  (ATIVAN ) injection 2 mg  2 mg Intramuscular TID PRN Onuoha, Chinwendu V, NP       hydrOXYzine  (ATARAX ) tablet 25 mg  25 mg Oral Q6H PRN Onuoha, Chinwendu V, NP   25 mg at 12/14/24 2126   loperamide  (IMODIUM ) capsule 2-4 mg  2-4 mg Oral PRN Onuoha, Chinwendu V, NP       methocarbamol  (ROBAXIN ) tablet 500 mg  500 mg Oral Q8H PRN May, Tanya, NP       multivitamin with minerals tablet 1 tablet  1 tablet Oral Daily Onuoha, Chinwendu V, NP   1 tablet at 12/14/24 0755   naproxen  (NAPROSYN ) tablet 500 mg  500 mg Oral BID PRN May, Tanya, NP       nicotine  (NICODERM CQ  - dosed in mg/24 hours) patch 14 mg  14 mg Transdermal Daily Jadapalle, Sree, MD   14 mg at 12/14/24 0755   ondansetron  (ZOFRAN -ODT) disintegrating tablet 4 mg  4 mg Oral Q6H PRN Onuoha, Chinwendu V, NP       QUEtiapine  (SEROQUEL )  tablet 50 mg  50 mg Oral QHS Trudy Carwin, NP   50 mg at 12/14/24 2126   Allergies[1] Principal Problem:   Alcohol use disorder  Blood pressure 116/74, pulse 81, temperature 97.8 F (36.6 C), temperature source Oral, resp. rate 16, height 5' 10.08 (1.78 m), weight 100.5 kg, SpO2 100%.    Keiko Myricks B Callan Norden 12/15/2024      [1] No Known Allergies

## 2024-12-15 NOTE — Group Note (Signed)
 Date:  12/15/2024 Time:  10:32 PM  Group Topic/Focus:  Overcoming Stress:   The focus of this group is to define stress and help patients assess their triggers.    Participation Level:  Active  Participation Quality:  Appropriate  Affect:  Appropriate  Cognitive:  Appropriate  Insight: Appropriate  Engagement in Group:  Engaged  Modes of Intervention:  Activity  Additional Comments:    Camellia HERO Kiante Petrovich 12/15/2024, 10:32 PM

## 2024-12-15 NOTE — Progress Notes (Signed)
" °   12/15/24 2300  Psych Admission Type (Psych Patients Only)  Admission Status Involuntary  Psychosocial Assessment  Patient Complaints None  Eye Contact Fair  Facial Expression Other (Comment) (WDL)  Affect Appropriate to circumstance  Speech Logical/coherent  Interaction Minimal  Motor Activity Other (Comment) (WDL)  Appearance/Hygiene Unremarkable  Behavior Characteristics Cooperative;Appropriate to situation  Mood Depressed  Aggressive Behavior  Effect No apparent injury  Thought Process  Coherency WDL  Content WDL  Delusions None reported or observed  Perception WDL  Hallucination None reported or observed  Judgment Limited  Confusion WDL  Danger to Self  Current suicidal ideation? Denies  Danger to Others  Danger to Others None reported or observed    "

## 2024-12-15 NOTE — Plan of Care (Signed)

## 2024-12-15 NOTE — Group Note (Signed)
 Date:  12/15/2024 Time:  1:44 PM  Group Topic/Focus:  Stages of Change:   The focus of this group is to explain the stages of change and help patients identify changes they want to make upon discharge.    Participation Level:  Active  Participation Quality:  Appropriate  Affect:  Appropriate  Cognitive:  Appropriate  Insight: Appropriate  Engagement in Group:  Engaged  Modes of Intervention:  Activity  Additional Comments:    Camellia HERO Winola Drum 12/15/2024, 1:44 PM

## 2024-12-15 NOTE — Progress Notes (Signed)
" °   12/15/24 1015  Psych Admission Type (Psych Patients Only)  Admission Status Involuntary  Psychosocial Assessment  Patient Complaints None  Eye Contact Fair  Facial Expression Flat  Affect Depressed  Speech Logical/coherent  Interaction Minimal  Motor Activity Slow  Appearance/Hygiene In scrubs  Behavior Characteristics Cooperative  Mood Depressed  Aggressive Behavior  Effect No apparent injury  Thought Process  Coherency WDL  Content WDL  Delusions None reported or observed  Perception WDL  Hallucination None reported or observed  Judgment Impaired  Confusion WDL  Danger to Self  Current suicidal ideation? Denies  Danger to Others  Danger to Others None reported or observed    "

## 2024-12-15 NOTE — Group Note (Unsigned)
 Date:  12/16/2024 Time:  2:05 AM  Group Topic/Focus:  Recovery Goals:   The focus of this group is to identify appropriate goals for recovery and establish a plan to achieve them. Wrap-Up Group:   The focus of this group is to help patients review their daily goal of treatment and discuss progress on daily workbooks.    Participation Level:  Active  Participation Quality:  Appropriate and Attentive  Affect:  Appropriate  Cognitive:  Alert, Appropriate, and Oriented  Insight: Appropriate and Good  Engagement in Group:  Engaged  Modes of Intervention:  Discussion and Support  Additional Comments:  N/A  Cory Vance 12/16/2024, 2:05 AM

## 2024-12-16 LAB — LIPID PANEL
Cholesterol: 245 mg/dL — ABNORMAL HIGH (ref 0–200)
HDL: 53 mg/dL
LDL Cholesterol: 140 mg/dL — ABNORMAL HIGH (ref 0–99)
Total CHOL/HDL Ratio: 4.6 ratio
Triglycerides: 256 mg/dL — ABNORMAL HIGH
VLDL: 51 mg/dL — ABNORMAL HIGH (ref 0–40)

## 2024-12-16 LAB — HEMOGLOBIN A1C
Hgb A1c MFr Bld: 5.1 % (ref 4.8–5.6)
Mean Plasma Glucose: 99.67 mg/dL

## 2024-12-16 LAB — TSH: TSH: 1.27 u[IU]/mL (ref 0.350–4.500)

## 2024-12-16 MED ORDER — ESCITALOPRAM OXALATE 10 MG PO TABS
10.0000 mg | ORAL_TABLET | Freq: Every day | ORAL | Status: DC
  Administered 2024-12-17 – 2024-12-19 (×3): 10 mg via ORAL
  Filled 2024-12-16 (×3): qty 1

## 2024-12-16 MED ORDER — QUETIAPINE FUMARATE 100 MG PO TABS
100.0000 mg | ORAL_TABLET | Freq: Every day | ORAL | Status: DC
  Administered 2024-12-16 – 2024-12-18 (×3): 100 mg via ORAL
  Filled 2024-12-16 (×3): qty 1

## 2024-12-16 NOTE — Progress Notes (Signed)
 [facility] MD Progress Note  12/16/2024 8:19 PM Cory Vance  MRN:  [id number]   Subjective:  Chart reviewed, case discussed in multidisciplinary meeting, patient seen during rounds.   2/1: Patient found in dayroom during rounds. He endorses he is doing good today. He remains discharge focused. He identifies that the guns have been secured in his home and he is focused on providing for his family. He continues to endorse that he will be returning to his home after discharge. He denies concerns/side effects from lexapro . He does express desire for seroquel  to be increased due to ongoing sleep troubles which lead to anxiety and overthinking. He denies SI, HI, and AVH.   1/31: Patient reports he is doing excellent. Patient remains discharge focused. He continues to minimize events of taking a gun to the shed. He endorses future orientation of returning to his home and being a good father. He reports desire to return back to work so he can take care of his family. He reports having spoken to his family, including his wife since being in the hospital and that the conversations went well. He denies any concerns with the medications but does endorse continued difficulty with sleep. He reports this has been ongoing for him. He denies SI, HI, and AVH.   1/30: Writer met with patient in his room. He is discharged focused and asks to be discharged today. He reports feeling better since being placed back on Suboxone .   Patient continues to minimize events leading up to presentation. He minimizes him grabbing a gun and the impulsiveness of his actions. He attributes the presentation to kratom and alcohol use.   He identifies being linked with [facility] and would like to continue with them. He reports that he wants to get out of the hospital and be a better dad. He identifies several responsibilities he feels he needs to get done. He is cooperative with assessment and discussion of further  hospitalization. He denies other concerns/complaints. He denies SI, HI, and AVH.   Past Psychiatric History: see h&P Family History: History reviewed. No pertinent family history. Social History:  Social History   Substance and Sexual Activity  Alcohol Use Yes     Social History   Substance and Sexual Activity  Drug Use Never    Social History   Socioeconomic History   Marital status: Single    Spouse name: Not on file   Number of children: Not on file   Years of education: Not on file   Highest education level: Not on file  Occupational History   Not on file  Tobacco Use   Smoking status: Never   Smokeless tobacco: Not on file  Vaping Use   Vaping status: Every Day  Substance and Sexual Activity   Alcohol use: Yes   Drug use: Never   Sexual activity: Yes  Other Topics Concern   Not on file  Social History Narrative   Not on file   Social Drivers of Health   Tobacco Use: Unknown (12/12/2024)   Patient History    Smoking Tobacco Use: Never    Smokeless Tobacco Use: Unknown    Passive Exposure: Not on file  Financial Resource Strain: Low Risk  (04/29/2024)   Received from [facility]   Overall Financial Resource Strain (CARDIA)    Difficulty of Paying Living Expenses: Not very hard  Food Insecurity: No Food Insecurity (12/12/2024)   Epic    Worried About Programme Researcher, Broadcasting/film/video in  the Last Year: Never true    Ran Out of Food in the Last Year: Never true  Transportation Needs: No Transportation Needs (12/12/2024)   Epic    Lack of Transportation (Medical): No    Lack of Transportation (Non-Medical): No  Physical Activity: Not on file  Stress: Not on file  Social Connections: Not on file  Depression (EYV7-0): Not on file  Alcohol Screen: Low Risk (12/12/2024)   Alcohol Screen    Last Alcohol Screening Score (AUDIT): 0  Housing: High Risk (12/12/2024)   Epic    Unable to Pay for Housing in the Last Year: Yes    Number of Times Moved in the Last  Year: 0    Homeless in the Last Year: No  Utilities: At Risk (12/12/2024)   Epic    Threatened with loss of utilities: Yes  Health Literacy: Not on file   Past Medical History: History reviewed. No pertinent past medical history. History reviewed. No pertinent surgical history.  Current Medications: Current Facility-Administered Medications  Medication Dose Route Frequency Provider Last Rate Last Admin   buprenorphine -naloxone  (SUBOXONE ) 2-0.5 mg per SL tablet 2 tablet  2 tablet Sublingual BID Aleni Andrus, NP   2 tablet at 12/16/24 1702   dicyclomine  (BENTYL ) tablet 20 mg  20 mg Oral Q6H PRN Garrie Elenes, NP       haloperidol  (HALDOL ) tablet 5 mg  5 mg Oral TID PRN Onuoha, Chinwendu V, NP       And   diphenhydrAMINE  (BENADRYL ) capsule 50 mg  50 mg Oral TID PRN Onuoha, Chinwendu V, NP       haloperidol  lactate (HALDOL ) injection 5 mg  5 mg Intramuscular TID PRN Onuoha, Chinwendu V, NP       And   diphenhydrAMINE  (BENADRYL ) injection 50 mg  50 mg Intramuscular TID PRN Onuoha, Chinwendu V, NP       And   LORazepam  (ATIVAN ) injection 2 mg  2 mg Intramuscular TID PRN Onuoha, Chinwendu V, NP       haloperidol  lactate (HALDOL ) injection 10 mg  10 mg Intramuscular TID PRN Onuoha, Chinwendu V, NP       And   diphenhydrAMINE  (BENADRYL ) injection 50 mg  50 mg Intramuscular TID PRN Onuoha, Chinwendu V, NP       And   LORazepam  (ATIVAN ) injection 2 mg  2 mg Intramuscular TID PRN Onuoha, Chinwendu V, NP       [START ON 12/17/2024] escitalopram  (LEXAPRO ) tablet 10 mg  10 mg Oral Daily Ottavio Norem, NP       methocarbamol  (ROBAXIN ) tablet 500 mg  500 mg Oral Q8H PRN Mindie Rawdon, NP       multivitamin with minerals tablet 1 tablet  1 tablet Oral Daily Onuoha, Chinwendu V, NP   1 tablet at 12/16/24 0749   naproxen  (NAPROSYN ) tablet 500 mg  500 mg Oral BID PRN Andrei Mccook, NP       nicotine  (NICODERM CQ  - dosed in mg/24 hours) patch 14 mg  14 mg Transdermal Daily Jadapalle, Sree, MD   14 mg at 12/16/24 9248    nicotine  polacrilex (NICORETTE ) gum 2 mg  2 mg Oral PRN Jadapalle, Sree, MD   2 mg at 12/16/24 1702   QUEtiapine  (SEROQUEL ) tablet 100 mg  100 mg Oral QHS Edwinna Rochette, NP        Lab Results:  Results for orders placed or performed during the hospital encounter of 12/12/24 (from the past 48 hours)  Hemoglobin A1c  Status: None   Collection Time: 12/16/24  9:16 AM  Result Value Ref Range   Hgb A1c MFr Bld 5.1 4.8 - 5.6 %    Comment: (NOTE) Diagnosis of Diabetes The following HbA1c ranges recommended by the American Diabetes Association (ADA) Lydian Chavous be used as an aid in the diagnosis of diabetes mellitus.  Hemoglobin             Suggested A1C NGSP%              Diagnosis  <5.7                   Non Diabetic  5.7-6.4                Pre-Diabetic  >6.4                   Diabetic  <7.0                   Glycemic control for                       adults with diabetes.     Mean Plasma Glucose 99.67 mg/dL    Comment: Performed at [facility], [address] [place], [place] [address]  Lipid panel     Status: Abnormal   Collection Time: 12/16/24  9:16 AM  Result Value Ref Range   Cholesterol 245 (H) 0 - 200 mg/dL    Comment:        ATP III CLASSIFICATION:  <200     mg/dL   Desirable  799-760  mg/dL   Borderline High  >=759    mg/dL   High           Triglycerides 256 (H) <150 mg/dL   HDL 53 >59 mg/dL   Total CHOL/HDL Ratio 4.6 RATIO   VLDL 51 (H) 0 - 40 mg/dL   LDL Cholesterol 859 (H) 0 - 99 mg/dL    Comment:        Total Cholesterol/HDL:CHD Risk Coronary Heart Disease Risk Table                     Men   Women  1/2 Average Risk   3.4   3.3  Average Risk       5.0   4.4  2 X Average Risk   9.6   7.1  3 X Average Risk  23.4   11.0        Use the calculated Patient Ratio above and the CHD Risk Table to determine the patient's CHD Risk.        ATP III CLASSIFICATION (LDL):  <100     mg/dL   Optimal  899-870  mg/dL   Near or Above                    Optimal   130-159  mg/dL   Borderline  839-810  mg/dL   High  >809     mg/dL   Very High Performed at [facility], [address] [place], [place] [address]   TSH     Status: None   Collection Time: 12/16/24  9:16 AM  Result Value Ref Range   TSH 1.270 0.350 - 4.500 uIU/mL    Comment: Performed at [facility], [address] [place], [place] [address]     Blood Alcohol level:  Lab Results  Component Value  Date   ETH 142 (H) 12/11/2024    Metabolic Disorder Labs: Lab Results  Component Value Date   HGBA1C 5.1 12/16/2024   MPG 99.67 12/16/2024   No results found for: PROLACTIN Lab Results  Component Value Date   CHOL 245 (H) 12/16/2024   TRIG 256 (H) 12/16/2024   HDL 53 12/16/2024   CHOLHDL 4.6 12/16/2024   VLDL 51 (H) 12/16/2024   LDLCALC 140 (H) 12/16/2024    Physical Findings: AIMS:  , ,  ,  ,    CIWA:  CIWA-Ar Total: 5 COWS:  COWS Total Score: 9   Psychiatric Specialty Exam:  Presentation  General Appearance:  Appropriate for Environment; Casual  Eye Contact: Good  Speech: Clear and Coherent; Normal Rate  Speech Volume: Normal    Mood and Affect  Mood: Euthymic  Affect: Appropriate   Thought Process  Thought Processes: Coherent; Goal Directed; Linear  Orientation:Full (Time, Place and Person)  Thought Content:Logical  Hallucinations:Hallucinations: None  Ideas of Reference:None  Suicidal Thoughts:Suicidal Thoughts: No  Homicidal Thoughts:Homicidal Thoughts: No   Sensorium  Memory: Immediate Fair  Judgment: Fair  Insight: Fair   Art Therapist  Concentration: Fair  Attention Span: Fair  Recall: Fair  Fund of Knowledge: Fair  Language: Fair   Psychomotor Activity  Psychomotor Activity: Psychomotor Activity: Normal  Musculoskeletal: Strength & Muscle Tone: within normal limits Gait & Station: normal Assets  Assets: Manufacturing Systems Engineer; Housing; Vocational/Educational;  Talents/Skills; Financial Resources/Insurance    Physical Exam: Physical Exam Vitals and nursing note reviewed.  Constitutional:      Appearance: Normal appearance.  Pulmonary:     Effort: Pulmonary effort is normal.  Neurological:     Mental Status: He is alert and oriented to person, place, and time.  Psychiatric:        Behavior: Behavior normal.        Thought Content: Thought content normal.    Review of Systems  Respiratory:  Negative for shortness of breath.   Cardiovascular:  Negative for chest pain.  Gastrointestinal:  Negative for diarrhea, nausea and vomiting.  Psychiatric/Behavioral:  Positive for depression and substance abuse. Negative for hallucinations and suicidal ideas. The patient is nervous/anxious.   All other systems reviewed and are negative.  Blood pressure 124/82, pulse 85, temperature 98.2 F (36.8 C), temperature source Oral, resp. rate 18, height 5' 10.08 (1.78 m), weight 100.5 kg, SpO2 100%. Body mass index is 31.71 kg/m.  Diagnosis: Principal Problem:   Alcohol use disorder   PLAN: Safety and Monitoring:  -- Involuntary admission to inpatient psychiatric unit for safety, stabilization and treatment  -- Daily contact with patient to assess and evaluate symptoms and progress in treatment  -- Patient's case to be discussed in multi-disciplinary team meeting  -- Observation Level : q15 minute checks  -- Vital signs:  q12 hours  -- Precautions: suicide, elopement, and assault -- Encouraged patient to participate in unit milieu and in scheduled group therapies  2. Psychiatric Treatment:  Scheduled Medications: Suboxone  to 4-1 mg BID (patient reports cutting strip in half at home and taking twice daily) Seroquel  100 mg nightly  Lexapro  10 mg daily  **Qtc 401 on 12/15/24   -- The risks/benefits/side-effects/alternatives to this medication were discussed in detail with the patient and time was given for questions. The patient consents to  medication trial.   3. Medical Issues Being Addressed:  None at this time - previously was concern for Opiate withdrawal, but patient restarted on MAT   4.  Discharge Planning:   -- Social work and case management to assist with discharge planning and identification of hospital follow-up needs prior to discharge  -- Estimated LOS: 5-7 days  Andy Moye, NP 12/16/2024, 8:19 PM

## 2024-12-16 NOTE — Progress Notes (Signed)
" °   12/16/24 0818  Psych Admission Type (Psych Patients Only)  Admission Status Involuntary  Psychosocial Assessment  Patient Complaints None  Eye Contact Fair  Facial Expression Flat  Affect Depressed  Speech Logical/coherent  Interaction Minimal  Motor Activity Slow  Appearance/Hygiene In scrubs  Behavior Characteristics Cooperative  Mood Depressed  Aggressive Behavior  Effect No apparent injury  Thought Process  Coherency WDL  Content WDL  Delusions None reported or observed  Perception WDL  Hallucination None reported or observed  Judgment Impaired  Confusion WDL  Danger to Self  Current suicidal ideation? Denies  Danger to Others  Danger to Others None reported or observed    "

## 2024-12-16 NOTE — Group Note (Signed)
 Date:  12/16/2024 Time:  8:59 PM  Group Topic/Focus:  Managing Feelings:   The focus of this group is to identify what feelings patients have difficulty handling and develop a plan to handle them in a healthier way upon discharge. Self Care:   The focus of this group is to help patients understand the importance of self-care in order to improve or restore emotional, physical, spiritual, interpersonal, and financial health. Wrap-Up Group:   The focus of this group is to help patients review their daily goal of treatment and discuss progress on daily workbooks.    Participation Level:  Active  Participation Quality:  Appropriate and Attentive  Affect:  Appropriate  Cognitive:  Alert, Appropriate, and Oriented  Insight: Appropriate and Good  Engagement in Group:  Engaged  Modes of Intervention:  Discussion and Support  Additional Comments:  N/A  Cory Vance 12/16/2024, 8:59 PM

## 2024-12-16 NOTE — Plan of Care (Signed)
   Problem: Education: Goal: Knowledge of Cory Vance [facility] Education information/materials will improve Outcome: Progressing Goal: Emotional status will improve Outcome: Progressing Goal: Mental status will improve Outcome: Progressing Goal: Verbalization of understanding the information provided will improve Outcome: Progressing   Problem: Activity: Goal: Interest or engagement in activities will improve Outcome: Progressing Goal: Sleeping patterns will improve Outcome: Progressing   Problem: Coping: Goal: Ability to verbalize frustrations and anger appropriately will improve Outcome: Progressing Goal: Ability to demonstrate self-control will improve Outcome: Progressing

## 2024-12-16 NOTE — Group Note (Signed)
 Date:  12/16/2024 Time:  7:17 PM  Group Topic/Focus:  Coping With Mental Health Crisis:   The purpose of this group is to help patients identify strategies for coping with mental health crisis.  Group discusses possible causes of crisis and ways to manage them effectively. Healthy Communication:   The focus of this group is to discuss communication, barriers to communication, as well as healthy ways to communicate with others.  A group session was conducted with patients utilizing the activity Two Truths and One Lie to promote peer interaction and allow both patients and facilitator to become more familiar with one another. Following this activity, patients participated in an arts and crafts intervention involving the creation of viral nursing glove dolls. Patients demonstrated enjoyment and engagement throughout the activity. This intervention provided an opportunity for patients to use a simple and accessible coping mechanism to redirect focus and promote mindfulness. Materials used were minimal, including one glove, a mask, and a marker. Patients appeared excited during the process and expressed a sense of accomplishment upon completing the activity.  Participation Level:  Active  Participation Quality:  Appropriate  Affect:  Appropriate  Cognitive:  Appropriate  Insight: Appropriate  Engagement in Group:  Engaged  Modes of Intervention:  Activity and Discussion  Additional Comments:    Loralye Loberg L Areonna Bran 12/16/2024, 7:17 PM

## 2024-12-16 NOTE — Plan of Care (Signed)

## 2024-12-17 NOTE — Plan of Care (Signed)

## 2024-12-17 NOTE — BHH Counselor (Signed)
 CSW contacted [facility] to schedule appointment, [phone].  CSW left HIPAA compliant voicemail.  Sherryle Margo, MSW, LCSW 12/17/2024 2:52 PM

## 2024-12-17 NOTE — Plan of Care (Signed)

## 2024-12-18 MED ORDER — NICOTINE 14 MG/24HR TD PT24: 14.0000 mg | MEDICATED_PATCH | Freq: Every day | TRANSDERMAL | 0 refills | Status: AC

## 2024-12-18 MED ORDER — ESCITALOPRAM OXALATE 10 MG PO TABS: 10.0000 mg | ORAL_TABLET | Freq: Every day | ORAL | 0 refills | Status: AC

## 2024-12-18 MED ORDER — QUETIAPINE FUMARATE 100 MG PO TABS: 100.0000 mg | ORAL_TABLET | Freq: Every day | ORAL | 0 refills | Status: AC

## 2024-12-18 NOTE — Progress Notes (Signed)
" °  [facility] Adult Case Management Discharge Plan :  Will you be returning to the same living situation after discharge:  No. Patient to discharge to his mother's home.  At discharge, do you have transportation home?: Yes,  Patient's friend, Glendia C. To provide transportation.  Do you have the ability to pay for your medications: Yes,  MEDCOST / MEDCOST ULTRA  Release of information consent forms completed and in the chart;  Patient's signature needed at discharge.  Patient to Follow up at:  Follow-up Information     [facility] Follow up.   Why: In person appointment with your care team is 12/20/24 at 1 PM for the continuation of therapy and psychiatric services. Contact information: Address: [address] [place], [place] [address]  Phone: [phone] Fax: [phone] [email or web address]                Next level of care provider has access to [facility] Link:no  Safety Planning and Suicide Prevention discussed: Yes,  Education Completed; Marina  Coomes/wife [phone])     Has patient been referred to the Quitline?: Patient refused referral for treatment  Patient has been referred for addiction treatment: Yes, the patient will follow up with an outpatient provider for substance use disorder. Psychiatrist/APP: appointment made, Therapist: appointment made, and PCP: appointment made  Alveta CHRISTELLA Kerns, LCSW 12/18/2024, 5:40 PM "

## 2024-12-18 NOTE — Group Note (Signed)
 Date:  12/18/2024 Time:  8:36 PM  Group Topic/Focus:  Wrap-Up Group:   The focus of this group is to help patients review their daily goal of treatment and discuss progress on daily workbooks.    Participation Level:  Active  Participation Quality:  Appropriate and Attentive  Affect:  Appropriate  Cognitive:  Alert and Appropriate  Insight: Appropriate  Engagement in Group:  Engaged  Modes of Intervention:  Discussion and Support  Additional Comments:    Ginny JONETTA Galeazzi 12/18/2024, 8:36 PM

## 2024-12-18 NOTE — Progress Notes (Signed)
" °   12/18/24 0916  Psych Admission Type (Psych Patients Only)  Admission Status Involuntary  Psychosocial Assessment  Patient Complaints None  Eye Contact Other (Comment) (WDL)  Facial Expression Flat  Affect Appropriate to circumstance  Speech Logical/coherent  Interaction Minimal  Motor Activity Other (Comment) (WDL)  Appearance/Hygiene Unremarkable  Behavior Characteristics Cooperative;Appropriate to situation;Calm  Mood Pleasant  Thought Process  Coherency WDL  Content WDL  Delusions None reported or observed  Perception WDL  Hallucination None reported or observed  Judgment WDL  Confusion None  Danger to Self  Current suicidal ideation? Denies  Agreement Not to Harm Self Yes  Description of Agreement verbal  Danger to Others  Danger to Others None reported or observed   D- Patient alert and oriented x 4. Pt presents with a pleasant mood and affect. Pt denies SI, HI, AVH, and pain. Pt states he slept well about 9 hours. Pt goal is to drink more water.   A- Scheduled medications administered to patient, per MD orders. Support and encouragement provided.  Routine safety checks conducted every 15 minutes.  Patient informed to notify staff with problems or concerns.  R- No adverse drug reactions noted. Patient contracts for safety at this time. Patient compliant with medications and treatment plan. Patient receptive, calm, and cooperative. Patient interacts well with others on the unit.  Patient remains safe at this time.    Shelly Spenser S.,RN "

## 2024-12-18 NOTE — Progress Notes (Signed)
" °   12/17/24 2100  Psych Admission Type (Psych Patients Only)  Admission Status Involuntary  Psychosocial Assessment  Patient Complaints None  Eye Contact Fair  Facial Expression Flat  Affect Appropriate to circumstance  Speech Logical/coherent  Interaction Minimal  Motor Activity Slow  Appearance/Hygiene Unremarkable  Behavior Characteristics Calm;Cooperative  Mood Pleasant  Thought Process  Coherency WDL  Content WDL  Delusions None reported or observed  Perception WDL  Hallucination None reported or observed  Judgment WDL  Confusion None  Danger to Self  Current suicidal ideation? Denies  Danger to Others  Danger to Others None reported or observed    "

## 2024-12-18 NOTE — Plan of Care (Signed)
   Problem: Education: Goal: Knowledge of [facility] Education information/materials will improve Outcome: Progressing Goal: Emotional status will improve Outcome: Progressing Goal: Mental status will improve Outcome: Progressing Goal: Verbalization of understanding the information provided will improve Outcome: Progressing   Problem: Activity: Goal: Interest or engagement in activities will improve Outcome: Progressing Goal: Sleeping patterns will improve Outcome: Progressing   Problem: Coping: Goal: Ability to verbalize frustrations and anger appropriately will improve Outcome: Progressing Goal: Ability to demonstrate self-control will improve Outcome: Progressing   Problem: Health Behavior/Discharge Planning: Goal: Identification of resources available to assist in meeting health care needs will improve Outcome: Progressing Goal: Compliance with treatment plan for underlying cause of condition will improve Outcome: Progressing   Problem: Physical Regulation: Goal: Ability to maintain clinical measurements within normal limits will improve Outcome: Progressing   Problem: Safety: Goal: Periods of time without injury will increase Outcome: Progressing   Problem: Education: Goal: Knowledge of General Education information will improve Description: Including pain rating scale, medication(s)/side effects and non-pharmacologic comfort measures Outcome: Progressing   Problem: Health Behavior/Discharge Planning: Goal: Ability to manage health-related needs will improve Outcome: Progressing   Problem: Clinical Measurements: Goal: Ability to maintain clinical measurements within normal limits will improve Outcome: Progressing Goal: Will remain free from infection Outcome: Progressing Goal: Diagnostic test results will improve Outcome: Progressing Goal: Respiratory complications will improve Outcome: Progressing Goal: Cardiovascular complication will be  avoided Outcome: Progressing   Problem: Activity: Goal: Risk for activity intolerance will decrease Outcome: Progressing   Problem: Nutrition: Goal: Adequate nutrition will be maintained Outcome: Progressing   Problem: Coping: Goal: Level of anxiety will decrease Outcome: Progressing   Problem: Elimination: Goal: Will not experience complications related to bowel motility Outcome: Progressing Goal: Will not experience complications related to urinary retention Outcome: Progressing   Problem: Pain Managment: Goal: General experience of comfort will improve and/or be controlled Outcome: Progressing   Problem: Safety: Goal: Ability to remain free from injury will improve Outcome: Progressing   Problem: Skin Integrity: Goal: Risk for impaired skin integrity will decrease Outcome: Progressing

## 2024-12-18 NOTE — [facility] Suicide Risk Assessment (Cosign Needed)
 [facility] Discharge Suicide Risk Assessment   Principal Problem: Alcohol use disorder Discharge Diagnoses: Principal Problem:   Alcohol use disorder Active Problems:   MDD (major depressive disorder), recurrent severe, without psychosis (HCC)   Total Time spent with patient: 1 hour  Musculoskeletal: Strength & Muscle Tone: within normal limits Gait & Station: normal Patient leans: N/A  Psychiatric Specialty Exam  Presentation  General Appearance:  Appropriate for Environment; Casual  Eye Contact: Good  Speech: Clear and Coherent; Normal Rate  Speech Volume: Normal  Handedness:No data recorded  Mood and Affect  Mood: Euthymic  Duration of Depression Symptoms: No data recorded Affect: Appropriate; Congruent   Thought Process  Thought Processes: Coherent; Goal Directed; Linear  Descriptions of Associations:Intact  Orientation:Full (Time, Place and Person)  Thought Content:Logical  History of Schizophrenia/Schizoaffective disorder:No data recorded Duration of Psychotic Symptoms:No data recorded Hallucinations:Hallucinations: None  Ideas of Reference:None  Suicidal Thoughts:Suicidal Thoughts: No  Homicidal Thoughts:Homicidal Thoughts: No   Sensorium  Memory: Immediate Fair; Recent Fair  Judgment: Fair  Insight: Fair   Art Therapist  Concentration: Good  Attention Span: Good  Recall: Good  Fund of Knowledge: Good  Language: Good   Psychomotor Activity  Psychomotor Activity:Psychomotor Activity: Normal   Assets  Assets: Communication Skills; Desire for Improvement; Vocational/Educational; Financial Resources/Insurance; Social Support; Housing   Sleep  Sleep:Sleep: Good  Estimated Sleeping Duration (Last 24 Hours): 7.50-9.00 hours  Physical Exam: Physical Exam Vitals and nursing note reviewed.  Constitutional:      Appearance: Normal appearance.  Pulmonary:     Effort: Pulmonary effort is normal.  Neurological:      Mental Status: He is alert and oriented to person, place, and time.  Psychiatric:        Mood and Affect: Mood normal.        Behavior: Behavior normal.        Thought Content: Thought content normal.    Review of Systems  Respiratory:  Negative for shortness of breath.   Cardiovascular:  Negative for chest pain.  Gastrointestinal:  Negative for diarrhea, nausea and vomiting.  Psychiatric/Behavioral:  Positive for substance abuse. Negative for depression, hallucinations and suicidal ideas. The patient is not nervous/anxious.   All other systems reviewed and are negative.  Blood pressure 111/70, pulse 88, temperature 97.7 F (36.5 C), temperature source Oral, resp. rate 20, height 5' 10.08 (1.78 m), weight 100.5 kg, SpO2 98%. Body mass index is 31.71 kg/m.  Mental Status Per Nursing Assessment::   On Admission:  NA  Demographic Factors:  Male and Caucasian  Loss Factors: NA  Historical Factors: Impulsivity  Risk Reduction Factors:   Responsible for children under 93 years of age, Sense of responsibility to family, Employed, and Living with another person, especially a relative  Continued Clinical Symptoms:  Alcohol/Substance Abuse/Dependencies Previous Psychiatric Diagnoses and Treatments  Cognitive Features That Contribute To Risk:  None    Suicide Risk:  Minimal: No identifiable suicidal ideation.  Patients presenting with no risk factors but with morbid ruminations; Cythina Mickelsen be classified as minimal risk based on the severity of the depressive symptoms   Follow-up Information     [facility] Follow up.   Why: In person appointment with your care team is 12/20/24 at 1 PM for the continuation of therapy and psychiatric services. Contact information: Address: [address], [place], [place] [address]  Phone: [phone] Fax: [phone] [email or web address]                Plan Of Care/Follow-up recommendations:  Follow up with outpatient  providers/appointments listed above.   Tanor Glaspy, NP 12/18/2024, 10:30 PM

## 2024-12-18 NOTE — Plan of Care (Signed)
   Problem: Education: Goal: Emotional status will improve Outcome: Progressing Goal: Mental status will improve Outcome: Progressing Goal: Verbalization of understanding the information provided will improve Outcome: Progressing   Problem: Activity: Goal: Interest or engagement in activities will improve Outcome: Progressing

## 2024-12-18 NOTE — Group Note (Signed)
 Recreation Therapy Group Note   Group Topic:Relaxation  Group Date: 12/18/2024 Start Time: 1500 End Time: 1540 Facilitators: Celestia Jeoffrey BRAVO, LRT, CTRS Location: Dayroom  Group Description: PMR (Progressive Muscle Relaxation). LRT educates patients on what PMR is and the benefits that come from it. Patients are asked to sit with their feet flat on the floor while sitting up and all the way back in their chair, if possible. LRT and pts follow a prompt through a speaker that requires you to tense and release different muscles in their body and focus on their breathing. During session, lights are off and soft music is being played. Pts are given a stress ball to use if needed.   Goal Area(s) Addressed:  Patients will be able to describe progressive muscle relaxation.  Patient will practice using relaxation technique. Patient will identify a new coping skill.  Patient will follow multistep directions to reduce anxiety and stress.   Affect/Mood: Appropriate   Participation Level: Minimal    Clinical Observations/Individualized Feedback: Cory Vance was present in the dayroom during group. Pt minimally completed the exercises as prompted. Pt shared that he is familiar with PMR and has done it in the past.    Plan: Continue to engage patient in RT group sessions 2-3x/week.   Jeoffrey BRAVO Celestia, LRT, CTRS 12/18/2024 4:03 PM

## 2024-12-18 NOTE — [facility] Counselor (Signed)
 CSW touched base with Marina  Klemp, wife, [phone] to engage in safe discharge planning pending patient's upcoming discharge of 12/19/24.  Wife reported no safety concerns and reported that the patient will be discharging to his mother's home.   CSW touched base with Thedora Herring, [phone], friend who confirmed that he will provide transportation at discharge. CSW confirmed location and arrival details with Thedora. Seth plans to arrive at 10:30 AM to provide transportation.   This has been communicated to providers.   CSW to continue to assess.   Joylyn Duggin, MSW, LCSWA 12/18/2024 11:07 AM

## 2024-12-19 NOTE — Plan of Care (Signed)
   Problem: Education: Goal: Emotional status will improve Outcome: Progressing

## 2024-12-19 NOTE — Progress Notes (Signed)
 Discharge Note:  Patient denies SI/HI/AVH at this time. Discharge instructions, AVS, prescriptions, and transition record gone over with patient. Patient agrees to comply with medication management, follow-up visit, and outpatient therapy. Patient belongings returned to patient. Patient questions and concerns addressed and answered.

## 2024-12-19 NOTE — Progress Notes (Signed)
" °   12/19/24 1000  Psych Admission Type (Psych Patients Only)  Admission Status Voluntary  Psychosocial Assessment  Patient Complaints None  Eye Contact Fair  Facial Expression Flat  Affect Appropriate to circumstance  Speech Logical/coherent  Interaction Minimal  Motor Activity Slow  Appearance/Hygiene Unremarkable  Behavior Characteristics Cooperative  Mood Pleasant  Thought Process  Coherency WDL  Content WDL  Delusions None reported or observed  Perception WDL  Hallucination None reported or observed  Judgment WDL  Confusion None  Danger to Self  Current suicidal ideation? Denies    "

## 2024-12-19 NOTE — Progress Notes (Signed)
" °   12/18/24 2000  Psych Admission Type (Psych Patients Only)  Admission Status Involuntary  Psychosocial Assessment  Patient Complaints None  Eye Contact Fair  Facial Expression Flat  Affect Appropriate to circumstance  Speech Logical/coherent  Interaction Minimal  Motor Activity Slow  Appearance/Hygiene Unremarkable  Behavior Characteristics Cooperative;Calm  Mood Pleasant  Thought Process  Coherency WDL  Content WDL  Delusions None reported or observed  Perception WDL  Hallucination None reported or observed  Judgment WDL  Confusion None  Danger to Self  Current suicidal ideation? Denies  Agreement Not to Harm Self Yes  Description of Agreement verbal  Danger to Others  Danger to Others None reported or observed    "

## 2024-12-19 NOTE — Plan of Care (Signed)
   Problem: Education: Goal: Knowledge of [facility] Education information/materials will improve Outcome: Progressing Goal: Emotional status will improve Outcome: Progressing Goal: Mental status will improve Outcome: Progressing Goal: Verbalization of understanding the information provided will improve Outcome: Progressing   Problem: Activity: Goal: Interest or engagement in activities will improve Outcome: Progressing Goal: Sleeping patterns will improve Outcome: Progressing   Problem: Coping: Goal: Ability to verbalize frustrations and anger appropriately will improve Outcome: Progressing Goal: Ability to demonstrate self-control will improve Outcome: Progressing   Problem: Health Behavior/Discharge Planning: Goal: Identification of resources available to assist in meeting health care needs will improve Outcome: Progressing Goal: Compliance with treatment plan for underlying cause of condition will improve Outcome: Progressing   Problem: Physical Regulation: Goal: Ability to maintain clinical measurements within normal limits will improve Outcome: Progressing   Problem: Safety: Goal: Periods of time without injury will increase Outcome: Progressing   Problem: Education: Goal: Knowledge of General Education information will improve Description: Including pain rating scale, medication(s)/side effects and non-pharmacologic comfort measures Outcome: Progressing   Problem: Health Behavior/Discharge Planning: Goal: Ability to manage health-related needs will improve Outcome: Progressing   Problem: Clinical Measurements: Goal: Ability to maintain clinical measurements within normal limits will improve Outcome: Progressing Goal: Will remain free from infection Outcome: Progressing Goal: Diagnostic test results will improve Outcome: Progressing Goal: Respiratory complications will improve Outcome: Progressing Goal: Cardiovascular complication will be  avoided Outcome: Progressing   Problem: Activity: Goal: Risk for activity intolerance will decrease Outcome: Progressing   Problem: Nutrition: Goal: Adequate nutrition will be maintained Outcome: Progressing   Problem: Coping: Goal: Level of anxiety will decrease Outcome: Progressing   Problem: Elimination: Goal: Will not experience complications related to bowel motility Outcome: Progressing Goal: Will not experience complications related to urinary retention Outcome: Progressing   Problem: Pain Managment: Goal: General experience of comfort will improve and/or be controlled Outcome: Progressing   Problem: Safety: Goal: Ability to remain free from injury will improve Outcome: Progressing   Problem: Skin Integrity: Goal: Risk for impaired skin integrity will decrease Outcome: Progressing
